# Patient Record
Sex: Female | Born: 1935
Health system: Southern US, Community
[De-identification: ages and names within clinical notes are randomized; demographics above are authoritative.]

## PROBLEM LIST (undated history)

## (undated) DIAGNOSIS — J189 Pneumonia, unspecified organism: Secondary | ICD-10-CM

---

## 2000-01-04 ENCOUNTER — Other Ambulatory Visit: Admission: RE | Admit: 2000-01-04 | Discharge: 2000-01-04 | Payer: Self-pay | Admitting: Obstetrics and Gynecology

## 2010-12-15 ENCOUNTER — Other Ambulatory Visit: Payer: Self-pay | Admitting: Ophthalmology

## 2011-11-10 DIAGNOSIS — M7750 Other enthesopathy of unspecified foot: Secondary | ICD-10-CM | POA: Diagnosis not present

## 2011-11-21 DIAGNOSIS — M722 Plantar fascial fibromatosis: Secondary | ICD-10-CM | POA: Diagnosis not present

## 2011-11-25 DIAGNOSIS — M722 Plantar fascial fibromatosis: Secondary | ICD-10-CM | POA: Diagnosis not present

## 2011-11-30 DIAGNOSIS — M722 Plantar fascial fibromatosis: Secondary | ICD-10-CM | POA: Diagnosis not present

## 2011-12-31 DIAGNOSIS — M722 Plantar fascial fibromatosis: Secondary | ICD-10-CM | POA: Diagnosis not present

## 2012-01-03 DIAGNOSIS — M722 Plantar fascial fibromatosis: Secondary | ICD-10-CM | POA: Diagnosis not present

## 2012-01-24 DIAGNOSIS — M899 Disorder of bone, unspecified: Secondary | ICD-10-CM | POA: Diagnosis not present

## 2012-01-24 DIAGNOSIS — M722 Plantar fascial fibromatosis: Secondary | ICD-10-CM | POA: Diagnosis not present

## 2012-03-14 DIAGNOSIS — H35369 Drusen (degenerative) of macula, unspecified eye: Secondary | ICD-10-CM | POA: Diagnosis not present

## 2012-03-29 DIAGNOSIS — Z1231 Encounter for screening mammogram for malignant neoplasm of breast: Secondary | ICD-10-CM | POA: Diagnosis not present

## 2012-04-25 DIAGNOSIS — M899 Disorder of bone, unspecified: Secondary | ICD-10-CM | POA: Diagnosis not present

## 2012-04-25 DIAGNOSIS — M949 Disorder of cartilage, unspecified: Secondary | ICD-10-CM | POA: Diagnosis not present

## 2012-04-27 DIAGNOSIS — Z23 Encounter for immunization: Secondary | ICD-10-CM | POA: Diagnosis not present

## 2012-06-04 DIAGNOSIS — D7589 Other specified diseases of blood and blood-forming organs: Secondary | ICD-10-CM | POA: Diagnosis not present

## 2012-06-04 DIAGNOSIS — I10 Essential (primary) hypertension: Secondary | ICD-10-CM | POA: Diagnosis not present

## 2012-06-04 DIAGNOSIS — Z1331 Encounter for screening for depression: Secondary | ICD-10-CM | POA: Diagnosis not present

## 2012-06-04 DIAGNOSIS — M899 Disorder of bone, unspecified: Secondary | ICD-10-CM | POA: Diagnosis not present

## 2012-06-07 DIAGNOSIS — D510 Vitamin B12 deficiency anemia due to intrinsic factor deficiency: Secondary | ICD-10-CM | POA: Diagnosis not present

## 2012-06-08 DIAGNOSIS — D510 Vitamin B12 deficiency anemia due to intrinsic factor deficiency: Secondary | ICD-10-CM | POA: Diagnosis not present

## 2012-06-11 DIAGNOSIS — D510 Vitamin B12 deficiency anemia due to intrinsic factor deficiency: Secondary | ICD-10-CM | POA: Diagnosis not present

## 2012-06-12 DIAGNOSIS — D510 Vitamin B12 deficiency anemia due to intrinsic factor deficiency: Secondary | ICD-10-CM | POA: Diagnosis not present

## 2012-06-13 DIAGNOSIS — D510 Vitamin B12 deficiency anemia due to intrinsic factor deficiency: Secondary | ICD-10-CM | POA: Diagnosis not present

## 2012-06-14 DIAGNOSIS — D510 Vitamin B12 deficiency anemia due to intrinsic factor deficiency: Secondary | ICD-10-CM | POA: Diagnosis not present

## 2012-06-15 DIAGNOSIS — D510 Vitamin B12 deficiency anemia due to intrinsic factor deficiency: Secondary | ICD-10-CM | POA: Diagnosis not present

## 2012-06-19 DIAGNOSIS — D510 Vitamin B12 deficiency anemia due to intrinsic factor deficiency: Secondary | ICD-10-CM | POA: Diagnosis not present

## 2012-06-26 DIAGNOSIS — D510 Vitamin B12 deficiency anemia due to intrinsic factor deficiency: Secondary | ICD-10-CM | POA: Diagnosis not present

## 2012-07-03 DIAGNOSIS — D510 Vitamin B12 deficiency anemia due to intrinsic factor deficiency: Secondary | ICD-10-CM | POA: Diagnosis not present

## 2012-07-10 DIAGNOSIS — D510 Vitamin B12 deficiency anemia due to intrinsic factor deficiency: Secondary | ICD-10-CM | POA: Diagnosis not present

## 2012-08-14 DIAGNOSIS — D510 Vitamin B12 deficiency anemia due to intrinsic factor deficiency: Secondary | ICD-10-CM | POA: Diagnosis not present

## 2012-09-17 DIAGNOSIS — D510 Vitamin B12 deficiency anemia due to intrinsic factor deficiency: Secondary | ICD-10-CM | POA: Diagnosis not present

## 2012-10-16 DIAGNOSIS — E538 Deficiency of other specified B group vitamins: Secondary | ICD-10-CM | POA: Diagnosis not present

## 2012-10-16 DIAGNOSIS — I10 Essential (primary) hypertension: Secondary | ICD-10-CM | POA: Diagnosis not present

## 2012-11-19 DIAGNOSIS — D510 Vitamin B12 deficiency anemia due to intrinsic factor deficiency: Secondary | ICD-10-CM | POA: Diagnosis not present

## 2012-12-21 DIAGNOSIS — D510 Vitamin B12 deficiency anemia due to intrinsic factor deficiency: Secondary | ICD-10-CM | POA: Diagnosis not present

## 2013-01-22 DIAGNOSIS — D510 Vitamin B12 deficiency anemia due to intrinsic factor deficiency: Secondary | ICD-10-CM | POA: Diagnosis not present

## 2013-02-22 DIAGNOSIS — D510 Vitamin B12 deficiency anemia due to intrinsic factor deficiency: Secondary | ICD-10-CM | POA: Diagnosis not present

## 2013-03-26 DIAGNOSIS — D510 Vitamin B12 deficiency anemia due to intrinsic factor deficiency: Secondary | ICD-10-CM | POA: Diagnosis not present

## 2013-04-08 DIAGNOSIS — Z1231 Encounter for screening mammogram for malignant neoplasm of breast: Secondary | ICD-10-CM | POA: Diagnosis not present

## 2013-04-26 DIAGNOSIS — D510 Vitamin B12 deficiency anemia due to intrinsic factor deficiency: Secondary | ICD-10-CM | POA: Diagnosis not present

## 2013-05-28 DIAGNOSIS — D510 Vitamin B12 deficiency anemia due to intrinsic factor deficiency: Secondary | ICD-10-CM | POA: Diagnosis not present

## 2013-05-28 DIAGNOSIS — Z23 Encounter for immunization: Secondary | ICD-10-CM | POA: Diagnosis not present

## 2013-06-21 DIAGNOSIS — Z0000 Encounter for general adult medical examination without abnormal findings: Secondary | ICD-10-CM | POA: Diagnosis not present

## 2013-06-21 DIAGNOSIS — Z1331 Encounter for screening for depression: Secondary | ICD-10-CM | POA: Diagnosis not present

## 2013-06-21 DIAGNOSIS — D510 Vitamin B12 deficiency anemia due to intrinsic factor deficiency: Secondary | ICD-10-CM | POA: Diagnosis not present

## 2013-06-21 DIAGNOSIS — M899 Disorder of bone, unspecified: Secondary | ICD-10-CM | POA: Diagnosis not present

## 2013-07-01 DIAGNOSIS — D510 Vitamin B12 deficiency anemia due to intrinsic factor deficiency: Secondary | ICD-10-CM | POA: Diagnosis not present

## 2013-08-05 DIAGNOSIS — D510 Vitamin B12 deficiency anemia due to intrinsic factor deficiency: Secondary | ICD-10-CM | POA: Diagnosis not present

## 2013-09-06 DIAGNOSIS — D510 Vitamin B12 deficiency anemia due to intrinsic factor deficiency: Secondary | ICD-10-CM | POA: Diagnosis not present

## 2013-10-07 DIAGNOSIS — D510 Vitamin B12 deficiency anemia due to intrinsic factor deficiency: Secondary | ICD-10-CM | POA: Diagnosis not present

## 2013-11-08 DIAGNOSIS — D510 Vitamin B12 deficiency anemia due to intrinsic factor deficiency: Secondary | ICD-10-CM | POA: Diagnosis not present

## 2013-11-13 DIAGNOSIS — H35369 Drusen (degenerative) of macula, unspecified eye: Secondary | ICD-10-CM | POA: Diagnosis not present

## 2013-12-09 DIAGNOSIS — D510 Vitamin B12 deficiency anemia due to intrinsic factor deficiency: Secondary | ICD-10-CM | POA: Diagnosis not present

## 2013-12-31 ENCOUNTER — Ambulatory Visit
Admission: RE | Admit: 2013-12-31 | Discharge: 2013-12-31 | Disposition: A | Discharge status: Home / Self Care | Payer: Medicare Other | Source: Ambulatory Visit | Attending: Nurse Practitioner | Admitting: Nurse Practitioner

## 2013-12-31 ENCOUNTER — Other Ambulatory Visit: Payer: Self-pay | Admitting: Nurse Practitioner

## 2013-12-31 DIAGNOSIS — J189 Pneumonia, unspecified organism: Secondary | ICD-10-CM | POA: Diagnosis not present

## 2014-01-02 DIAGNOSIS — J189 Pneumonia, unspecified organism: Secondary | ICD-10-CM | POA: Diagnosis not present

## 2014-01-07 DIAGNOSIS — J189 Pneumonia, unspecified organism: Secondary | ICD-10-CM | POA: Diagnosis not present

## 2014-01-28 ENCOUNTER — Other Ambulatory Visit: Payer: Self-pay | Admitting: Internal Medicine

## 2014-01-28 ENCOUNTER — Ambulatory Visit
Admission: RE | Admit: 2014-01-28 | Discharge: 2014-01-28 | Disposition: A | Discharge status: Home / Self Care | Payer: Medicare Other | Source: Ambulatory Visit | Attending: Internal Medicine | Admitting: Internal Medicine

## 2014-01-28 DIAGNOSIS — J189 Pneumonia, unspecified organism: Secondary | ICD-10-CM

## 2014-04-09 DIAGNOSIS — Z1231 Encounter for screening mammogram for malignant neoplasm of breast: Secondary | ICD-10-CM | POA: Diagnosis not present

## 2014-04-28 DIAGNOSIS — Z23 Encounter for immunization: Secondary | ICD-10-CM | POA: Diagnosis not present

## 2014-06-15 ENCOUNTER — Encounter (HOSPITAL_COMMUNITY): Payer: Self-pay

## 2014-06-15 ENCOUNTER — Emergency Department (HOSPITAL_COMMUNITY): Payer: MEDICARE

## 2014-06-15 ENCOUNTER — Emergency Department (HOSPITAL_COMMUNITY)
Admission: EM | Admit: 2014-06-15 | Discharge: 2014-06-15 | Disposition: A | Discharge status: Home / Self Care | Payer: MEDICARE | Attending: Emergency Medicine | Admitting: Emergency Medicine

## 2014-06-15 DIAGNOSIS — I517 Cardiomegaly: Secondary | ICD-10-CM | POA: Diagnosis not present

## 2014-06-15 DIAGNOSIS — R05 Cough: Secondary | ICD-10-CM | POA: Diagnosis present

## 2014-06-15 DIAGNOSIS — J189 Pneumonia, unspecified organism: Secondary | ICD-10-CM

## 2014-06-15 DIAGNOSIS — J159 Unspecified bacterial pneumonia: Secondary | ICD-10-CM | POA: Diagnosis not present

## 2014-06-15 DIAGNOSIS — R918 Other nonspecific abnormal finding of lung field: Secondary | ICD-10-CM | POA: Diagnosis not present

## 2014-06-15 DIAGNOSIS — R059 Cough, unspecified: Secondary | ICD-10-CM

## 2014-06-15 HISTORY — DX: Pneumonia, unspecified organism: J18.9

## 2014-06-15 LAB — COMPREHENSIVE METABOLIC PANEL
ALBUMIN: 3.6 g/dL (ref 3.5–5.2)
ALT: 17 U/L (ref 0–35)
ANION GAP: 13 (ref 5–15)
AST: 26 U/L (ref 0–37)
Alkaline Phosphatase: 103 U/L (ref 39–117)
BUN: 12 mg/dL (ref 6–23)
CALCIUM: 9.3 mg/dL (ref 8.4–10.5)
CO2: 26 mEq/L (ref 19–32)
CREATININE: 0.75 mg/dL (ref 0.50–1.10)
Chloride: 100 mEq/L (ref 96–112)
GFR calc Af Amer: 89 mL/min — ABNORMAL LOW (ref >90)
GFR calc non Af Amer: 77 mL/min — ABNORMAL LOW (ref >90)
Glucose, Bld: 92 mg/dL (ref 70–99)
POTASSIUM: 4.3 meq/L (ref 3.7–5.3)
Sodium: 139 mEq/L (ref 137–147)
TOTAL PROTEIN: 7.4 g/dL (ref 6.0–8.3)
Total Bilirubin: 0.4 mg/dL (ref 0.3–1.2)

## 2014-06-15 LAB — CBC WITH DIFFERENTIAL/PLATELET
BASOS PCT: 1 % (ref 0–1)
Basophils Absolute: 0 10*3/uL (ref 0.0–0.1)
EOS ABS: 0.3 10*3/uL (ref 0.0–0.7)
Eosinophils Relative: 5 % (ref 0–5)
HCT: 40.7 % (ref 36.0–46.0)
Hemoglobin: 13.3 g/dL (ref 12.0–15.0)
Lymphocytes Relative: 44 % (ref 12–46)
Lymphs Abs: 2.5 10*3/uL (ref 0.7–4.0)
MCH: 29.7 pg (ref 26.0–34.0)
MCHC: 32.7 g/dL (ref 30.0–36.0)
MCV: 90.8 fL (ref 78.0–100.0)
MONOS PCT: 6 % (ref 3–12)
Monocytes Absolute: 0.4 10*3/uL (ref 0.1–1.0)
Neutro Abs: 2.5 10*3/uL (ref 1.7–7.7)
Neutrophils Relative %: 44 % (ref 43–77)
Platelets: 239 10*3/uL (ref 150–400)
RBC: 4.48 MIL/uL (ref 3.87–5.11)
RDW: 13 % (ref 11.5–15.5)
WBC: 5.7 10*3/uL (ref 4.0–10.5)

## 2014-06-15 MED ORDER — AZITHROMYCIN 250 MG PO TABS: 250.0000 mg | ORAL_TABLET | Freq: Every day | ORAL | Status: AC

## 2014-06-15 MED ORDER — DEXTROSE 5 % IV SOLN
1.0000 g | Freq: Once | INTRAVENOUS | Status: AC
  Administered 2014-06-15: 1 g via INTRAVENOUS
  Filled 2014-06-15: qty 10

## 2014-06-15 MED ORDER — AZITHROMYCIN 250 MG PO TABS
500.0000 mg | ORAL_TABLET | Freq: Once | ORAL | Status: AC
  Administered 2014-06-15: 500 mg via ORAL
  Filled 2014-06-15: qty 2

## 2014-06-15 NOTE — Discharge Instructions (Signed)

## 2014-06-15 NOTE — ED Provider Notes (Signed)
CSN: [id number]     Arrival date & time 06/15/14  1335 History   First MD Initiated Contact with Patient 06/15/14 1630     Chief Complaint  Patient presents with  . Cough   Patient is a 80 y.o. female presenting with cough. The history is provided by the patient.  Cough Cough characteristics:  Productive Sputum characteristics:  White and clear Severity:  Mild Onset quality:  Gradual Duration:  3 days Timing:  Intermittent Progression:  Worsening Chronicity:  New Smoker: no   Context: sick contacts   Context: not upper respiratory infection   Relieved by:  Nothing Worsened by:  Nothing tried Ineffective treatments: honey and lemon. Associated symptoms: no chest pain, no chills, no diaphoresis, no fever, no headaches, no rash, no shortness of breath and no sinus congestion   Risk factors: no recent infection     Past Medical History  Diagnosis Date  . Pneumonia    History reviewed. No pertinent past surgical history.   No family history on file.   History  Substance Use Topics  . Smoking status: Never Smoker   . Smokeless tobacco: Not on file  . Alcohol Use: No   OB History    No data available     Review of Systems  Constitutional: Negative for fever, chills and diaphoresis.  Respiratory: Positive for cough. Negative for chest tightness and shortness of breath.   Cardiovascular: Negative for chest pain and leg swelling.  Gastrointestinal: Negative for nausea, vomiting and abdominal pain.  Musculoskeletal: Negative for gait problem.  Skin: Negative for rash.  Neurological: Negative for speech difficulty and headaches.  All other systems reviewed and are negative.     Allergies  Review of patient's allergies indicates no known allergies.  Home Medications   Prior to Admission medications   Medication Sig Start Date End Date Taking? Authorizing Provider  Ascorbic Acid (VITAMIN C) 1000 MG tablet Take 1,000 mg by mouth daily.   Yes Historical Provider, MD   Multiple Vitamin (MULTI VITAMIN DAILY PO) Take 1 tablet by mouth daily.   Yes Historical Provider, MD  azithromycin (ZITHROMAX) 250 MG tablet Take 1 tablet (250 mg total) by mouth daily. 06/16/14 06/19/14  Berenice Primas, MD   BP 184/83 mmHg  Pulse 77  Temp(Src) 98.3 F (36.8 C) (Oral)  Resp 18  Ht 5\' 4"  (1.626 m)  SpO2 95%   Physical Exam  Constitutional: She is oriented to person, place, and time. She appears well-developed and well-nourished. She is cooperative. No distress.  Well appearing elderly female in no distress, smiling, laughing  HENT:  Head: Normocephalic and atraumatic.  Right Ear: External ear normal.  Left Ear: External ear normal.  Neck: Normal range of motion and phonation normal.  Cardiovascular: Normal rate and regular rhythm.   Pulmonary/Chest: Effort normal. No accessory muscle usage. No tachypnea. No respiratory distress. She has no wheezes. She has rhonchi (diffusely). She has no rales.  Speaking in complete sentences, no supplemental oxygen requirement  Abdominal: Soft. She exhibits no distension. There is no tenderness. There is no rebound and no guarding.  Neurological: She is alert and oriented to person, place, and time.  Skin: Skin is warm and dry. No rash noted. She is not diaphoretic.  Vitals reviewed.   ED Course  Procedures  Labs Review Labs Reviewed  COMPREHENSIVE METABOLIC PANEL - Abnormal; Notable for the following:    GFR calc non Af Amer 77 (*)    GFR calc Af Amer 89 (*)  All other components within normal limits  CBC WITH DIFFERENTIAL    Imaging Review Dg Chest 2 View  06/15/2014   CLINICAL DATA:  Productive cough for 2 days.  EXAM: CHEST  2 VIEW  COMPARISON:  01/28/2014 and 12/31/2013 chest radiographs  FINDINGS: Cardiomegaly and interstitial prominence again noted.  More focal left lower lobe opacity is suspicious for pneumonia, but had a similar appearance earlier this year.  There is no evidence of pneumothorax or pleural  effusion.  No acute bony abnormalities are identified.  IMPRESSION: Left lower lobe opacity suspicious for pneumonia. Radiographic follow-up to resolution recommended.  Unchanged cardiomegaly and interstitial prominence.   Electronically Signed   By: Hassan Rowan M.D.   On: 06/15/2014 15:19    MDM   Final diagnoses:  CAP (community acquired pneumonia)    80 y.o. female who reports a past medical history of community acquired pneumonia in June 2015. Presents to the ED due to a productive cough and feeling like her chest was "rattling". Denies chest pain, shortness of breath, nausea, vomiting, headache, weakness, fever, chills. She states that she otherwise feels fine, but was worried about the rattling noise. She has been trying honey and lemon to soothe her cough.  Remainder of history of present illness, review of systems, and exam as above. She is alert awake well appearing in no acute distress.  Chest x-ray obtained which shows a left lower lobe opacity concerning for pneumonia. CBC unremarkable. CMP essentially unremarkable except for a GFR of 77. Port score 72. Based on her clinical appearance she is suitable for outpatient management of her community-acquired pneumonia. She is speaking complete sentences, no distress, no supplemental oxygen requirement. Denies shortness of breath and chest pain. Ambulated from triage to her exam room with no distress.  Gave her a dose of IV Rocephin. If her 500 mg PO azithromycin. Will have her take azithromycin as an outpatient. I discussed very strict return precautions with the patient and these were given in writing as well. She is to follow-up with her primary care physician this week. She understands to return immediately to the emergency department for any new or worsening symptoms.  This case was managed in conjunction with my attending, Dr. Winfred Leeds.     Berenice Primas, MD 06/15/14 [place], MD 06/16/14 Laureen Abrahams

## 2014-06-15 NOTE — ED Provider Notes (Signed)
Complains of productive cough yellow sputum for 3 days. She denies shortness of breath.no vomiting no other associated symptoms. Treated herself withHeidi and limited use, without relief. Past medical history pneumonia June2015on exam alert not ill-appearing no distress lungs scant rhonchi diffusely. No respiratory distress, speaks in paragraphs. Chest x-ray viewed by me. Port score equals 72. Patient is suitable for outpatient treatment for community acquired pneumonia  Orlie Dakin, MD 06/15/14 1747

## 2014-06-15 NOTE — ED Notes (Signed)
Pt has had a cough and congestion for the past 3 days. Passing it around with her husband. Has some rattling at night when she lays down.

## 2014-06-23 DIAGNOSIS — Z23 Encounter for immunization: Secondary | ICD-10-CM | POA: Diagnosis not present

## 2014-06-23 DIAGNOSIS — L821 Other seborrheic keratosis: Secondary | ICD-10-CM | POA: Diagnosis not present

## 2014-06-23 DIAGNOSIS — I10 Essential (primary) hypertension: Secondary | ICD-10-CM | POA: Diagnosis not present

## 2014-06-23 DIAGNOSIS — R3915 Urgency of urination: Secondary | ICD-10-CM | POA: Diagnosis not present

## 2014-06-23 DIAGNOSIS — J189 Pneumonia, unspecified organism: Secondary | ICD-10-CM | POA: Diagnosis not present

## 2014-06-23 DIAGNOSIS — D510 Vitamin B12 deficiency anemia due to intrinsic factor deficiency: Secondary | ICD-10-CM | POA: Diagnosis not present

## 2014-07-03 DIAGNOSIS — J189 Pneumonia, unspecified organism: Secondary | ICD-10-CM | POA: Diagnosis not present

## 2014-07-14 ENCOUNTER — Ambulatory Visit
Admission: RE | Admit: 2014-07-14 | Discharge: 2014-07-14 | Disposition: A | Discharge status: Home / Self Care | Payer: Medicare Other | Source: Ambulatory Visit | Attending: Internal Medicine | Admitting: Internal Medicine

## 2014-07-14 ENCOUNTER — Other Ambulatory Visit: Payer: Self-pay | Admitting: Internal Medicine

## 2014-07-14 DIAGNOSIS — J189 Pneumonia, unspecified organism: Secondary | ICD-10-CM

## 2014-08-29 DIAGNOSIS — Z1389 Encounter for screening for other disorder: Secondary | ICD-10-CM | POA: Diagnosis not present

## 2014-08-29 DIAGNOSIS — D510 Vitamin B12 deficiency anemia due to intrinsic factor deficiency: Secondary | ICD-10-CM | POA: Diagnosis not present

## 2014-08-29 DIAGNOSIS — Z6841 Body Mass Index (BMI) 40.0 and over, adult: Secondary | ICD-10-CM | POA: Diagnosis not present

## 2014-08-29 DIAGNOSIS — Z0000 Encounter for general adult medical examination without abnormal findings: Secondary | ICD-10-CM | POA: Diagnosis not present

## 2014-08-29 DIAGNOSIS — M8580 Other specified disorders of bone density and structure, unspecified site: Secondary | ICD-10-CM | POA: Diagnosis not present

## 2014-09-11 DIAGNOSIS — L820 Inflamed seborrheic keratosis: Secondary | ICD-10-CM | POA: Diagnosis not present

## 2014-09-11 DIAGNOSIS — L821 Other seborrheic keratosis: Secondary | ICD-10-CM | POA: Diagnosis not present

## 2014-09-30 DIAGNOSIS — M810 Age-related osteoporosis without current pathological fracture: Secondary | ICD-10-CM | POA: Diagnosis not present

## 2014-12-16 DIAGNOSIS — H5213 Myopia, bilateral: Secondary | ICD-10-CM | POA: Diagnosis not present

## 2014-12-16 DIAGNOSIS — Z961 Presence of intraocular lens: Secondary | ICD-10-CM | POA: Diagnosis not present

## 2015-04-14 DIAGNOSIS — Z1231 Encounter for screening mammogram for malignant neoplasm of breast: Secondary | ICD-10-CM | POA: Diagnosis not present

## 2015-05-11 DIAGNOSIS — R221 Localized swelling, mass and lump, neck: Secondary | ICD-10-CM | POA: Diagnosis not present

## 2015-08-05 DIAGNOSIS — J069 Acute upper respiratory infection, unspecified: Secondary | ICD-10-CM | POA: Diagnosis not present

## 2015-09-03 DIAGNOSIS — Z6841 Body Mass Index (BMI) 40.0 and over, adult: Secondary | ICD-10-CM | POA: Diagnosis not present

## 2015-09-03 DIAGNOSIS — D510 Vitamin B12 deficiency anemia due to intrinsic factor deficiency: Secondary | ICD-10-CM | POA: Diagnosis not present

## 2015-09-03 DIAGNOSIS — Z1389 Encounter for screening for other disorder: Secondary | ICD-10-CM | POA: Diagnosis not present

## 2015-09-03 DIAGNOSIS — M8580 Other specified disorders of bone density and structure, unspecified site: Secondary | ICD-10-CM | POA: Diagnosis not present

## 2015-09-03 DIAGNOSIS — I10 Essential (primary) hypertension: Secondary | ICD-10-CM | POA: Diagnosis not present

## 2015-11-19 DIAGNOSIS — N816 Rectocele: Secondary | ICD-10-CM | POA: Diagnosis not present

## 2015-11-24 IMAGING — CR DG CHEST 2V
2 series · 2 of 2 positions shown · non-contrast
Comparison: None.

CLINICAL DATA: Cough, congestion, wheezing

EXAM:
CHEST  2 VIEW

[w chest pa]
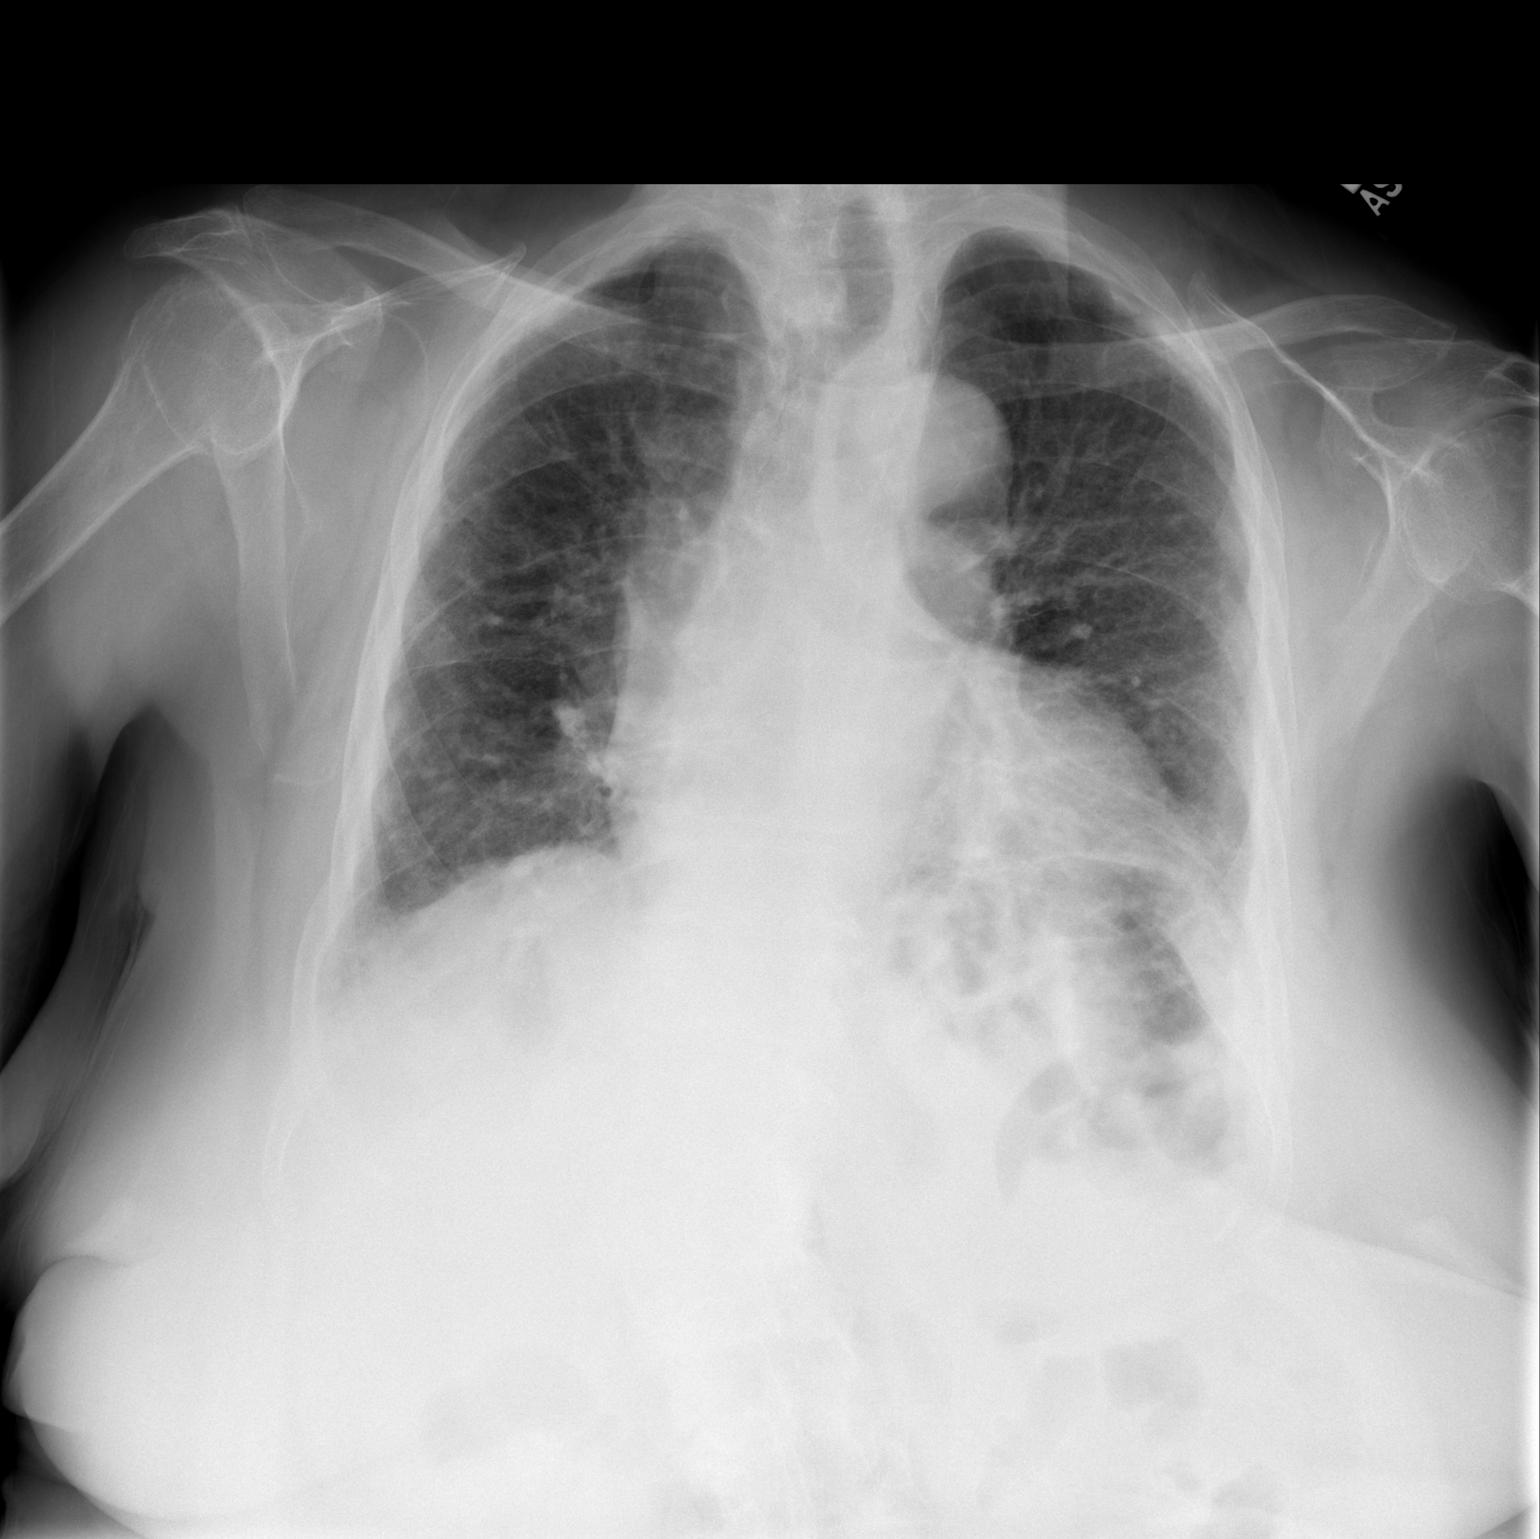

[w chest lat]
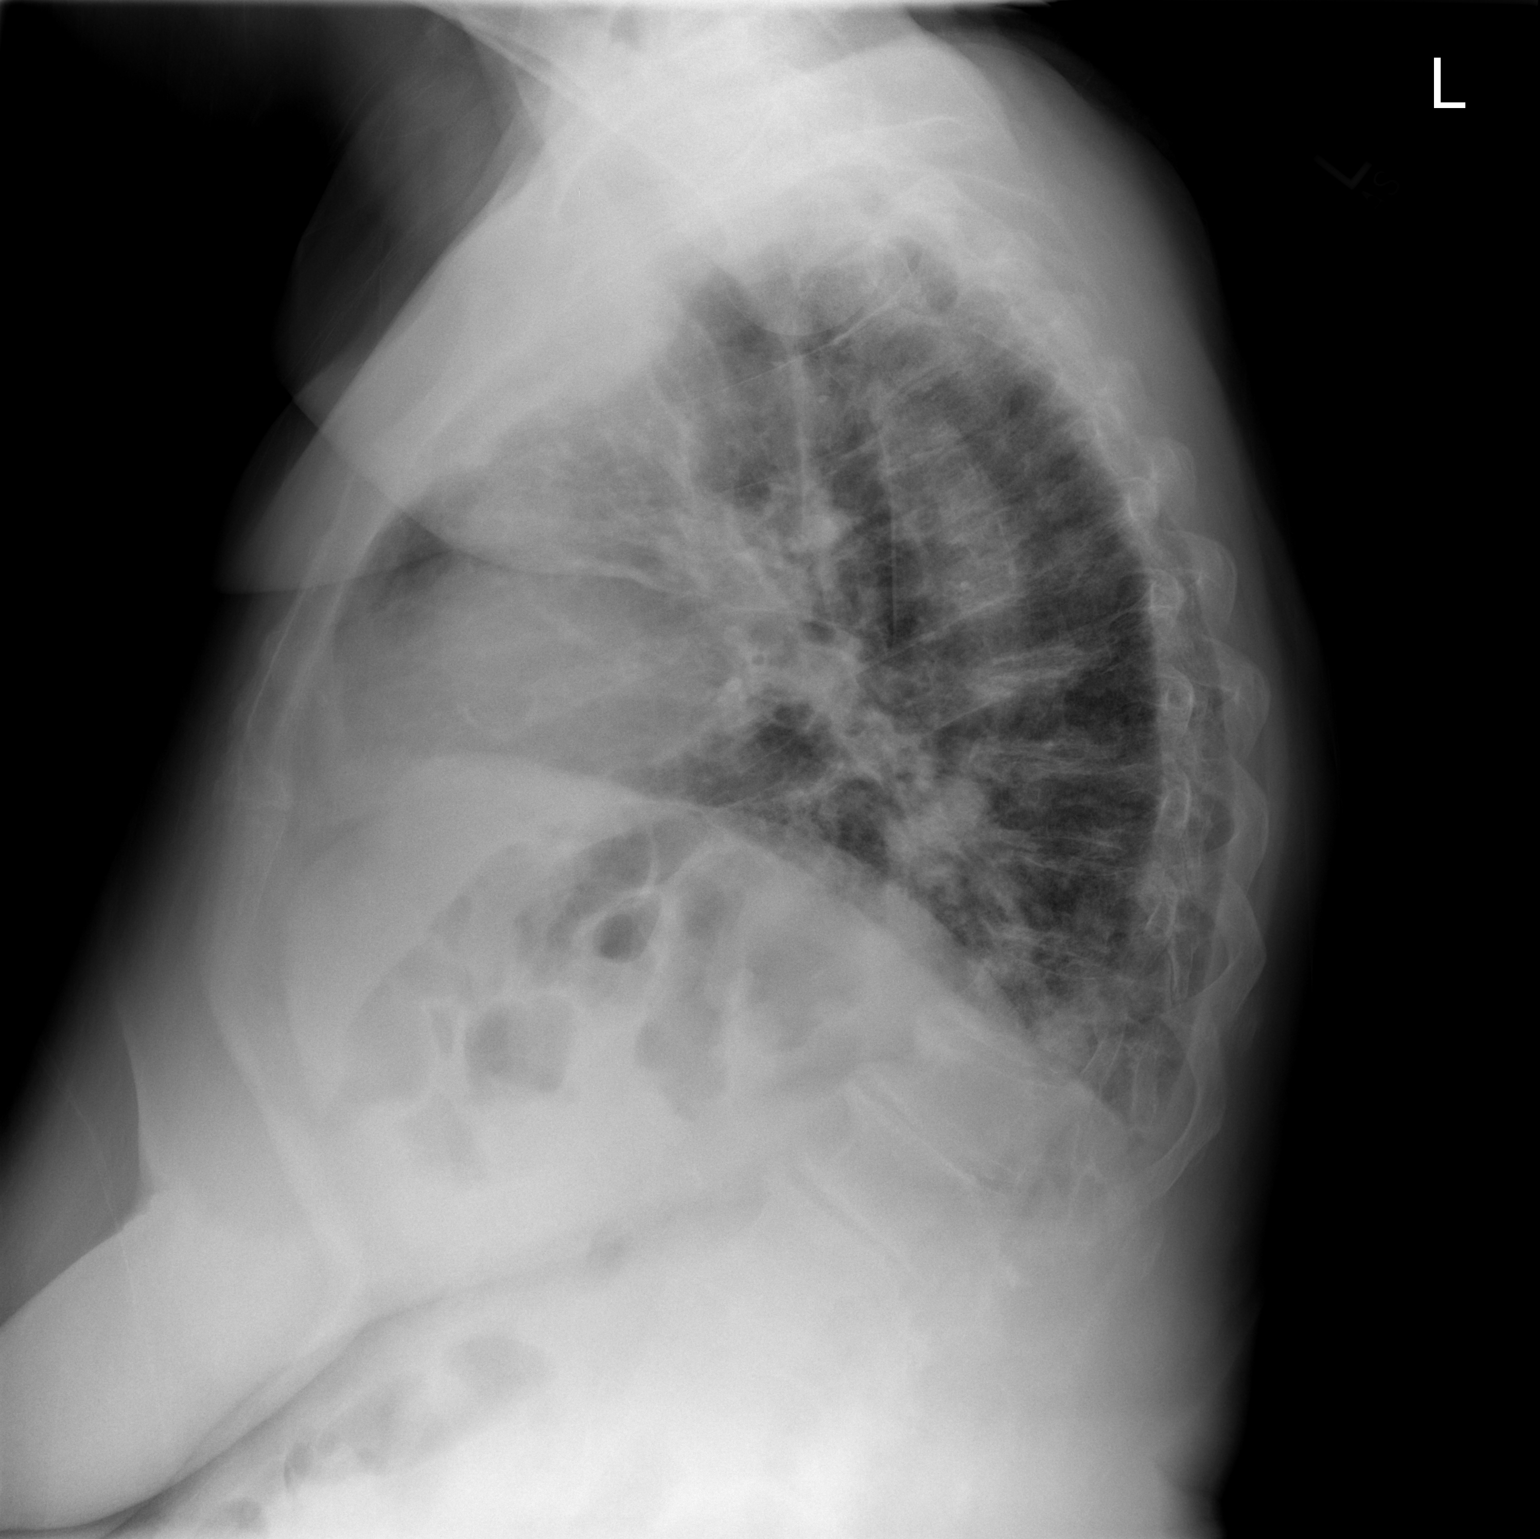

[2 of 2 positions shown; findings below may reference images not displayed]

FINDINGS: There are prominent markings medially and posteriorly in the left
lower lobe most consistent with pneumonia. No definite pleural
effusion is seen. There is some peribronchial thickening as well
which may indicate bronchitis. There is cardiomegaly present. The
bones are osteopenic and there is a thoracolumbar scoliosis with
associated degenerative change.
IMPRESSION: Left lower lobe pneumonia.  Probable bronchitis.  Cardiomegaly

## 2015-11-30 DIAGNOSIS — N816 Rectocele: Secondary | ICD-10-CM | POA: Diagnosis not present

## 2015-12-18 DIAGNOSIS — Z961 Presence of intraocular lens: Secondary | ICD-10-CM | POA: Diagnosis not present

## 2015-12-18 DIAGNOSIS — H5213 Myopia, bilateral: Secondary | ICD-10-CM | POA: Diagnosis not present

## 2016-04-14 DIAGNOSIS — Z1231 Encounter for screening mammogram for malignant neoplasm of breast: Secondary | ICD-10-CM | POA: Diagnosis not present

## 2016-06-03 DIAGNOSIS — Z23 Encounter for immunization: Secondary | ICD-10-CM | POA: Diagnosis not present

## 2016-10-11 DIAGNOSIS — M8580 Other specified disorders of bone density and structure, unspecified site: Secondary | ICD-10-CM | POA: Diagnosis not present

## 2016-10-11 DIAGNOSIS — Z23 Encounter for immunization: Secondary | ICD-10-CM | POA: Diagnosis not present

## 2016-10-11 DIAGNOSIS — I10 Essential (primary) hypertension: Secondary | ICD-10-CM | POA: Diagnosis not present

## 2016-10-11 DIAGNOSIS — D510 Vitamin B12 deficiency anemia due to intrinsic factor deficiency: Secondary | ICD-10-CM | POA: Diagnosis not present

## 2016-10-11 DIAGNOSIS — Z0000 Encounter for general adult medical examination without abnormal findings: Secondary | ICD-10-CM | POA: Diagnosis not present

## 2016-10-11 DIAGNOSIS — I350 Nonrheumatic aortic (valve) stenosis: Secondary | ICD-10-CM | POA: Diagnosis not present

## 2016-10-11 DIAGNOSIS — Z7189 Other specified counseling: Secondary | ICD-10-CM | POA: Diagnosis not present

## 2016-10-11 DIAGNOSIS — Z1389 Encounter for screening for other disorder: Secondary | ICD-10-CM | POA: Diagnosis not present

## 2016-10-25 DIAGNOSIS — R319 Hematuria, unspecified: Secondary | ICD-10-CM | POA: Diagnosis not present

## 2017-02-24 DIAGNOSIS — Z961 Presence of intraocular lens: Secondary | ICD-10-CM | POA: Diagnosis not present

## 2017-02-24 DIAGNOSIS — H524 Presbyopia: Secondary | ICD-10-CM | POA: Diagnosis not present

## 2017-03-28 DIAGNOSIS — N816 Rectocele: Secondary | ICD-10-CM | POA: Diagnosis not present

## 2017-05-02 DIAGNOSIS — Z23 Encounter for immunization: Secondary | ICD-10-CM | POA: Diagnosis not present

## 2017-06-06 DIAGNOSIS — Z1231 Encounter for screening mammogram for malignant neoplasm of breast: Secondary | ICD-10-CM | POA: Diagnosis not present

## 2017-09-18 DIAGNOSIS — R300 Dysuria: Secondary | ICD-10-CM | POA: Diagnosis not present

## 2017-09-18 DIAGNOSIS — N816 Rectocele: Secondary | ICD-10-CM | POA: Diagnosis not present

## 2017-10-16 DIAGNOSIS — D510 Vitamin B12 deficiency anemia due to intrinsic factor deficiency: Secondary | ICD-10-CM | POA: Diagnosis not present

## 2017-10-16 DIAGNOSIS — Z1389 Encounter for screening for other disorder: Secondary | ICD-10-CM | POA: Diagnosis not present

## 2017-10-16 DIAGNOSIS — N816 Rectocele: Secondary | ICD-10-CM | POA: Diagnosis not present

## 2017-10-16 DIAGNOSIS — M8580 Other specified disorders of bone density and structure, unspecified site: Secondary | ICD-10-CM | POA: Diagnosis not present

## 2017-10-16 DIAGNOSIS — I350 Nonrheumatic aortic (valve) stenosis: Secondary | ICD-10-CM | POA: Diagnosis not present

## 2017-10-16 DIAGNOSIS — J4520 Mild intermittent asthma, uncomplicated: Secondary | ICD-10-CM | POA: Diagnosis not present

## 2017-10-16 DIAGNOSIS — Z0000 Encounter for general adult medical examination without abnormal findings: Secondary | ICD-10-CM | POA: Diagnosis not present

## 2017-11-28 DIAGNOSIS — M8588 Other specified disorders of bone density and structure, other site: Secondary | ICD-10-CM | POA: Diagnosis not present

## 2017-11-28 DIAGNOSIS — M810 Age-related osteoporosis without current pathological fracture: Secondary | ICD-10-CM | POA: Diagnosis not present

## 2018-01-09 DIAGNOSIS — N816 Rectocele: Secondary | ICD-10-CM | POA: Diagnosis not present

## 2018-01-09 DIAGNOSIS — N8112 Cystocele, lateral: Secondary | ICD-10-CM | POA: Diagnosis not present

## 2018-01-09 DIAGNOSIS — N8111 Cystocele, midline: Secondary | ICD-10-CM | POA: Diagnosis not present

## 2018-01-09 DIAGNOSIS — N8189 Other female genital prolapse: Secondary | ICD-10-CM | POA: Diagnosis not present

## 2018-01-09 DIAGNOSIS — N952 Postmenopausal atrophic vaginitis: Secondary | ICD-10-CM | POA: Diagnosis not present

## 2018-01-09 DIAGNOSIS — N905 Atrophy of vulva: Secondary | ICD-10-CM | POA: Diagnosis not present

## 2018-01-18 DIAGNOSIS — D510 Vitamin B12 deficiency anemia due to intrinsic factor deficiency: Secondary | ICD-10-CM | POA: Diagnosis not present

## 2018-02-28 DIAGNOSIS — Z961 Presence of intraocular lens: Secondary | ICD-10-CM | POA: Diagnosis not present

## 2018-02-28 DIAGNOSIS — H524 Presbyopia: Secondary | ICD-10-CM | POA: Diagnosis not present

## 2018-05-10 DIAGNOSIS — Z23 Encounter for immunization: Secondary | ICD-10-CM | POA: Diagnosis not present

## 2018-06-11 DIAGNOSIS — Z1231 Encounter for screening mammogram for malignant neoplasm of breast: Secondary | ICD-10-CM | POA: Diagnosis not present

## 2018-12-25 DIAGNOSIS — Z0000 Encounter for general adult medical examination without abnormal findings: Secondary | ICD-10-CM | POA: Diagnosis not present

## 2018-12-25 DIAGNOSIS — Z1389 Encounter for screening for other disorder: Secondary | ICD-10-CM | POA: Diagnosis not present

## 2018-12-25 DIAGNOSIS — I350 Nonrheumatic aortic (valve) stenosis: Secondary | ICD-10-CM | POA: Diagnosis not present

## 2019-03-04 DIAGNOSIS — Z961 Presence of intraocular lens: Secondary | ICD-10-CM | POA: Diagnosis not present

## 2019-03-04 DIAGNOSIS — H524 Presbyopia: Secondary | ICD-10-CM | POA: Diagnosis not present

## 2019-03-28 ENCOUNTER — Encounter: Payer: Self-pay | Admitting: Emergency Medicine

## 2019-03-28 ENCOUNTER — Other Ambulatory Visit: Payer: Self-pay

## 2019-03-28 ENCOUNTER — Inpatient Hospital Stay
Admission: EM | Admit: 2019-03-28 | Discharge: 2019-03-28 | DRG: 563 | Disposition: A | Discharge status: Other Acute Inpatient Hospital | Payer: Medicare HMO | Attending: Internal Medicine | Admitting: Internal Medicine

## 2019-03-28 ENCOUNTER — Inpatient Hospital Stay: Payer: Medicare HMO

## 2019-03-28 ENCOUNTER — Inpatient Hospital Stay (HOSPITAL_COMMUNITY)
Admission: AD | Admit: 2019-03-28 | Payer: Medicare HMO | Source: Other Acute Inpatient Hospital | Admitting: Internal Medicine

## 2019-03-28 ENCOUNTER — Emergency Department: Payer: Medicare HMO

## 2019-03-28 ENCOUNTER — Inpatient Hospital Stay (HOSPITAL_COMMUNITY)
Admission: AD | Admit: 2019-03-28 | Discharge: 2019-04-04 | DRG: 492 | Disposition: A | Discharge status: Home Health | Payer: Medicare HMO | Attending: Internal Medicine | Admitting: Internal Medicine

## 2019-03-28 DIAGNOSIS — S82832D Other fracture of upper and lower end of left fibula, subsequent encounter for closed fracture with routine healing: Secondary | ICD-10-CM | POA: Diagnosis not present

## 2019-03-28 DIAGNOSIS — J8489 Other specified interstitial pulmonary diseases: Secondary | ICD-10-CM | POA: Diagnosis not present

## 2019-03-28 DIAGNOSIS — M2550 Pain in unspecified joint: Secondary | ICD-10-CM | POA: Diagnosis not present

## 2019-03-28 DIAGNOSIS — S82302A Unspecified fracture of lower end of left tibia, initial encounter for closed fracture: Secondary | ICD-10-CM | POA: Diagnosis not present

## 2019-03-28 DIAGNOSIS — J9601 Acute respiratory failure with hypoxia: Secondary | ICD-10-CM | POA: Diagnosis not present

## 2019-03-28 DIAGNOSIS — S82392A Other fracture of lower end of left tibia, initial encounter for closed fracture: Principal | ICD-10-CM | POA: Diagnosis present

## 2019-03-28 DIAGNOSIS — M8580 Other specified disorders of bone density and structure, unspecified site: Secondary | ICD-10-CM | POA: Diagnosis present

## 2019-03-28 DIAGNOSIS — S82202A Unspecified fracture of shaft of left tibia, initial encounter for closed fracture: Secondary | ICD-10-CM

## 2019-03-28 DIAGNOSIS — J189 Pneumonia, unspecified organism: Secondary | ICD-10-CM | POA: Diagnosis not present

## 2019-03-28 DIAGNOSIS — R0689 Other abnormalities of breathing: Secondary | ICD-10-CM | POA: Diagnosis not present

## 2019-03-28 DIAGNOSIS — R748 Abnormal levels of other serum enzymes: Secondary | ICD-10-CM

## 2019-03-28 DIAGNOSIS — R410 Disorientation, unspecified: Secondary | ICD-10-CM | POA: Diagnosis not present

## 2019-03-28 DIAGNOSIS — T148XXA Other injury of unspecified body region, initial encounter: Secondary | ICD-10-CM

## 2019-03-28 DIAGNOSIS — S82402A Unspecified fracture of shaft of left fibula, initial encounter for closed fracture: Secondary | ICD-10-CM | POA: Diagnosis not present

## 2019-03-28 DIAGNOSIS — R000 Tachycardia, unspecified: Secondary | ICD-10-CM | POA: Diagnosis not present

## 2019-03-28 DIAGNOSIS — Z419 Encounter for procedure for purposes other than remedying health state, unspecified: Secondary | ICD-10-CM

## 2019-03-28 DIAGNOSIS — Z7401 Bed confinement status: Secondary | ICD-10-CM | POA: Diagnosis not present

## 2019-03-28 DIAGNOSIS — R0902 Hypoxemia: Secondary | ICD-10-CM | POA: Diagnosis not present

## 2019-03-28 DIAGNOSIS — S0990XA Unspecified injury of head, initial encounter: Secondary | ICD-10-CM | POA: Diagnosis not present

## 2019-03-28 DIAGNOSIS — Z20828 Contact with and (suspected) exposure to other viral communicable diseases: Secondary | ICD-10-CM | POA: Diagnosis present

## 2019-03-28 DIAGNOSIS — S8262XA Displaced fracture of lateral malleolus of left fibula, initial encounter for closed fracture: Secondary | ICD-10-CM | POA: Diagnosis present

## 2019-03-28 DIAGNOSIS — Z79899 Other long term (current) drug therapy: Secondary | ICD-10-CM | POA: Diagnosis not present

## 2019-03-28 DIAGNOSIS — S82832A Other fracture of upper and lower end of left fibula, initial encounter for closed fracture: Secondary | ICD-10-CM | POA: Diagnosis present

## 2019-03-28 DIAGNOSIS — S2241XA Multiple fractures of ribs, right side, initial encounter for closed fracture: Secondary | ICD-10-CM | POA: Diagnosis present

## 2019-03-28 DIAGNOSIS — I10 Essential (primary) hypertension: Secondary | ICD-10-CM | POA: Diagnosis present

## 2019-03-28 DIAGNOSIS — I739 Peripheral vascular disease, unspecified: Secondary | ICD-10-CM | POA: Diagnosis not present

## 2019-03-28 DIAGNOSIS — J9811 Atelectasis: Secondary | ICD-10-CM | POA: Diagnosis not present

## 2019-03-28 DIAGNOSIS — Y92410 Unspecified street and highway as the place of occurrence of the external cause: Secondary | ICD-10-CM

## 2019-03-28 DIAGNOSIS — M549 Dorsalgia, unspecified: Secondary | ICD-10-CM | POA: Diagnosis present

## 2019-03-28 DIAGNOSIS — R52 Pain, unspecified: Secondary | ICD-10-CM | POA: Diagnosis not present

## 2019-03-28 DIAGNOSIS — S82302D Unspecified fracture of lower end of left tibia, subsequent encounter for closed fracture with routine healing: Secondary | ICD-10-CM | POA: Diagnosis not present

## 2019-03-28 DIAGNOSIS — M1712 Unilateral primary osteoarthritis, left knee: Secondary | ICD-10-CM | POA: Diagnosis not present

## 2019-03-28 DIAGNOSIS — S199XXA Unspecified injury of neck, initial encounter: Secondary | ICD-10-CM | POA: Diagnosis not present

## 2019-03-28 DIAGNOSIS — R531 Weakness: Secondary | ICD-10-CM | POA: Diagnosis not present

## 2019-03-28 LAB — COMPREHENSIVE METABOLIC PANEL
ALT: 29 U/L (ref 0–44)
AST: 47 U/L — ABNORMAL HIGH (ref 15–41)
Albumin: 3.7 g/dL (ref 3.5–5.0)
Alkaline Phosphatase: 79 U/L (ref 38–126)
Anion gap: 14 (ref 5–15)
BUN: 18 mg/dL (ref 8–23)
CO2: 23 mmol/L (ref 22–32)
Calcium: 9.1 mg/dL (ref 8.9–10.3)
Chloride: 105 mmol/L (ref 98–111)
Creatinine, Ser: 0.89 mg/dL (ref 0.44–1.00)
GFR calc Af Amer: >60 mL/min (ref >60)
GFR calc non Af Amer: 59 mL/min — ABNORMAL LOW (ref >60)
Glucose, Bld: 127 mg/dL — ABNORMAL HIGH (ref 70–99)
Potassium: 4.1 mmol/L (ref 3.5–5.1)
Sodium: 142 mmol/L (ref 135–145)
Total Bilirubin: 1 mg/dL (ref 0.3–1.2)
Total Protein: 7.3 g/dL (ref 6.5–8.1)

## 2019-03-28 LAB — CBC
HCT: 36.2 % (ref 36.0–46.0)
Hemoglobin: 11.8 g/dL — ABNORMAL LOW (ref 12.0–15.0)
MCH: 30.2 pg (ref 26.0–34.0)
MCHC: 32.6 g/dL (ref 30.0–36.0)
MCV: 92.6 fL (ref 80.0–100.0)
Platelets: 216 10*3/uL (ref 150–400)
RBC: 3.91 MIL/uL (ref 3.87–5.11)
RDW: 13.1 % (ref 11.5–15.5)
WBC: 9.1 10*3/uL (ref 4.0–10.5)
nRBC: 0 % (ref 0.0–0.2)

## 2019-03-28 LAB — CBC WITH DIFFERENTIAL/PLATELET
Abs Immature Granulocytes: 0.05 10*3/uL (ref 0.00–0.07)
Basophils Absolute: 0.1 10*3/uL (ref 0.0–0.1)
Basophils Relative: 1 %
Eosinophils Absolute: 0.3 10*3/uL (ref 0.0–0.5)
Eosinophils Relative: 4 %
HCT: 40.5 % (ref 36.0–46.0)
Hemoglobin: 13.2 g/dL (ref 12.0–15.0)
Immature Granulocytes: 1 %
Lymphocytes Relative: 24 %
Lymphs Abs: 1.8 10*3/uL (ref 0.7–4.0)
MCH: 29.7 pg (ref 26.0–34.0)
MCHC: 32.6 g/dL (ref 30.0–36.0)
MCV: 91.2 fL (ref 80.0–100.0)
Monocytes Absolute: 0.4 10*3/uL (ref 0.1–1.0)
Monocytes Relative: 6 %
Neutro Abs: 4.8 10*3/uL (ref 1.7–7.7)
Neutrophils Relative %: 64 %
Platelets: 228 10*3/uL (ref 150–400)
RBC: 4.44 MIL/uL (ref 3.87–5.11)
RDW: 13.1 % (ref 11.5–15.5)
WBC: 7.5 10*3/uL (ref 4.0–10.5)
nRBC: 0 % (ref 0.0–0.2)

## 2019-03-28 LAB — CREATININE, SERUM
Creatinine, Ser: 0.73 mg/dL (ref 0.44–1.00)
GFR calc Af Amer: >60 mL/min (ref >60)
GFR calc non Af Amer: >60 mL/min (ref >60)

## 2019-03-28 LAB — TYPE AND SCREEN
ABO/RH(D): B POS
Antibody Screen: NEGATIVE

## 2019-03-28 LAB — CK: Total CK: 1680 U/L — ABNORMAL HIGH (ref 38–234)

## 2019-03-28 LAB — TSH: TSH: 0.413 u[IU]/mL (ref 0.350–4.500)

## 2019-03-28 LAB — PHOSPHORUS: Phosphorus: 4.1 mg/dL (ref 2.5–4.6)

## 2019-03-28 LAB — MAGNESIUM: Magnesium: 1.7 mg/dL (ref 1.7–2.4)

## 2019-03-28 LAB — SARS CORONAVIRUS 2 BY RT PCR (HOSPITAL ORDER, PERFORMED IN ~~LOC~~ HOSPITAL LAB): SARS Coronavirus 2: NEGATIVE

## 2019-03-28 MED ORDER — MORPHINE SULFATE (PF) 2 MG/ML IV SOLN
2.0000 mg | INTRAVENOUS | Status: DC | PRN
  Administered 2019-03-29 (×2): 2 mg via INTRAVENOUS
  Filled 2019-03-28 (×2): qty 1

## 2019-03-28 MED ORDER — FENTANYL CITRATE (PF) 100 MCG/2ML IJ SOLN
12.5000 ug | INTRAMUSCULAR | Status: DC | PRN
  Administered 2019-03-28: 12.5 ug via INTRAVENOUS
  Filled 2019-03-28: qty 2

## 2019-03-28 MED ORDER — OXYCODONE-ACETAMINOPHEN 5-325 MG PO TABS
1.0000 | ORAL_TABLET | ORAL | Status: DC | PRN
  Administered 2019-03-28 – 2019-03-30 (×4): 1 via ORAL
  Filled 2019-03-28 (×4): qty 1

## 2019-03-28 MED ORDER — HEPARIN SODIUM (PORCINE) 5000 UNIT/ML IJ SOLN
5000.0000 [IU] | Freq: Three times a day (TID) | INTRAMUSCULAR | Status: DC
  Administered 2019-03-28: 5000 [IU] via SUBCUTANEOUS
  Filled 2019-03-28: qty 1

## 2019-03-28 NOTE — Progress Notes (Signed)
Distal tibia fracture will need ORIF.  Plan for gentle reduction in ED and post reduction CT scan.  Transfer to hospitalist service at [facility] to be planned at [facility] tomorrow with Dr. Doreatha Martin.  Please have coronavirus swab completed as well.
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
## 2019-03-28 NOTE — ED Notes (Signed)
Lab at bedside redrawing type and screen.
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
## 2019-03-29 ENCOUNTER — Encounter (HOSPITAL_COMMUNITY)
Admission: AD | Disposition: A | Discharge status: Home Health | Payer: Self-pay | Source: Home / Self Care | Attending: Internal Medicine

## 2019-03-29 ENCOUNTER — Inpatient Hospital Stay (HOSPITAL_COMMUNITY): Payer: Medicare HMO | Admitting: Anesthesiology

## 2019-03-29 ENCOUNTER — Other Ambulatory Visit: Payer: Self-pay

## 2019-03-29 ENCOUNTER — Encounter (HOSPITAL_COMMUNITY): Payer: Self-pay | Admitting: *Deleted

## 2019-03-29 ENCOUNTER — Inpatient Hospital Stay (HOSPITAL_COMMUNITY): Payer: Medicare HMO

## 2019-03-29 DIAGNOSIS — Z419 Encounter for procedure for purposes other than remedying health state, unspecified: Secondary | ICD-10-CM | POA: Insufficient documentation

## 2019-03-29 DIAGNOSIS — R748 Abnormal levels of other serum enzymes: Secondary | ICD-10-CM

## 2019-03-29 HISTORY — PX: OPEN REDUCTION INTERNAL FIXATION (ORIF) TIBIA/FIBULA FRACTURE: SHX5992

## 2019-03-29 LAB — CBC WITH DIFFERENTIAL/PLATELET
Abs Immature Granulocytes: 0.03 10*3/uL (ref 0.00–0.07)
Basophils Absolute: 0 10*3/uL (ref 0.0–0.1)
Basophils Relative: 0 %
Eosinophils Absolute: 0 10*3/uL (ref 0.0–0.5)
Eosinophils Relative: 0 %
HCT: 35.4 % — ABNORMAL LOW (ref 36.0–46.0)
Hemoglobin: 11.7 g/dL — ABNORMAL LOW (ref 12.0–15.0)
Immature Granulocytes: 0 %
Lymphocytes Relative: 8 %
Lymphs Abs: 0.7 10*3/uL (ref 0.7–4.0)
MCH: 30.2 pg (ref 26.0–34.0)
MCHC: 33.1 g/dL (ref 30.0–36.0)
MCV: 91.2 fL (ref 80.0–100.0)
Monocytes Absolute: 0.6 10*3/uL (ref 0.1–1.0)
Monocytes Relative: 7 %
Neutro Abs: 7.1 10*3/uL (ref 1.7–7.7)
Neutrophils Relative %: 85 %
Platelets: 192 10*3/uL (ref 150–400)
RBC: 3.88 MIL/uL (ref 3.87–5.11)
RDW: 12.9 % (ref 11.5–15.5)
WBC: 8.5 10*3/uL (ref 4.0–10.5)
nRBC: 0 % (ref 0.0–0.2)

## 2019-03-29 LAB — CBC
HCT: 33.2 % — ABNORMAL LOW (ref 36.0–46.0)
Hemoglobin: 10.8 g/dL — ABNORMAL LOW (ref 12.0–15.0)
MCH: 29.9 pg (ref 26.0–34.0)
MCHC: 32.5 g/dL (ref 30.0–36.0)
MCV: 92 fL (ref 80.0–100.0)
Platelets: 193 10*3/uL (ref 150–400)
RBC: 3.61 MIL/uL — ABNORMAL LOW (ref 3.87–5.11)
RDW: 13 % (ref 11.5–15.5)
WBC: 8.6 10*3/uL (ref 4.0–10.5)
nRBC: 0 % (ref 0.0–0.2)

## 2019-03-29 LAB — BASIC METABOLIC PANEL
Anion gap: 10 (ref 5–15)
BUN: 14 mg/dL (ref 8–23)
CO2: 26 mmol/L (ref 22–32)
Calcium: 8.4 mg/dL — ABNORMAL LOW (ref 8.9–10.3)
Chloride: 102 mmol/L (ref 98–111)
Creatinine, Ser: 0.76 mg/dL (ref 0.44–1.00)
GFR calc Af Amer: >60 mL/min (ref >60)
GFR calc non Af Amer: >60 mL/min (ref >60)
Glucose, Bld: 110 mg/dL — ABNORMAL HIGH (ref 70–99)
Potassium: 4 mmol/L (ref 3.5–5.1)
Sodium: 138 mmol/L (ref 135–145)

## 2019-03-29 LAB — CK
Total CK: 1782 U/L — ABNORMAL HIGH (ref 38–234)
Total CK: 1999 U/L — ABNORMAL HIGH (ref 38–234)

## 2019-03-29 LAB — SURGICAL PCR SCREEN
MRSA, PCR: NEGATIVE
Staphylococcus aureus: NEGATIVE

## 2019-03-29 SURGERY — OPEN REDUCTION INTERNAL FIXATION (ORIF) TIBIA/FIBULA FRACTURE
Anesthesia: General | Laterality: Left

## 2019-03-29 MED ORDER — ONDANSETRON HCL 4 MG/2ML IJ SOLN: 4.0000 mg | Freq: Once | INTRAMUSCULAR | Status: DC | PRN

## 2019-03-29 MED ORDER — VANCOMYCIN HCL 1000 MG IV SOLR
INTRAVENOUS | Status: AC
  Filled 2019-03-29: qty 1000

## 2019-03-29 MED ORDER — SODIUM CHLORIDE 0.9 % IV SOLN
INTRAVENOUS | Status: DC | PRN
  Administered 2019-03-29: 25 ug/min via INTRAVENOUS

## 2019-03-29 MED ORDER — FENTANYL CITRATE (PF) 100 MCG/2ML IJ SOLN: 25.0000 ug | INTRAMUSCULAR | Status: DC | PRN

## 2019-03-29 MED ORDER — LIDOCAINE 2% (20 MG/ML) 5 ML SYRINGE
INTRAMUSCULAR | Status: AC
  Filled 2019-03-29: qty 5

## 2019-03-29 MED ORDER — TOBRAMYCIN SULFATE 1.2 G IJ SOLR
INTRAMUSCULAR | Status: AC
  Filled 2019-03-29: qty 1.2

## 2019-03-29 MED ORDER — CEFAZOLIN SODIUM-DEXTROSE 2-4 GM/100ML-% IV SOLN
2.0000 g | INTRAVENOUS | Status: AC
  Administered 2019-03-29: 14:00:00 2 g via INTRAVENOUS
  Filled 2019-03-29: qty 100

## 2019-03-29 MED ORDER — SODIUM CHLORIDE 0.9 % IV SOLN
INTRAVENOUS | Status: DC
  Administered 2019-03-29 (×2): via INTRAVENOUS

## 2019-03-29 MED ORDER — GABAPENTIN 100 MG PO CAPS
100.0000 mg | ORAL_CAPSULE | Freq: Three times a day (TID) | ORAL | Status: DC
  Administered 2019-03-29 – 2019-03-30 (×3): 100 mg via ORAL
  Filled 2019-03-29 (×3): qty 1

## 2019-03-29 MED ORDER — ROCURONIUM BROMIDE 50 MG/5ML IV SOSY
PREFILLED_SYRINGE | INTRAVENOUS | Status: DC | PRN
  Administered 2019-03-29: 50 mg via INTRAVENOUS

## 2019-03-29 MED ORDER — PHENYLEPHRINE 40 MCG/ML (10ML) SYRINGE FOR IV PUSH (FOR BLOOD PRESSURE SUPPORT)
PREFILLED_SYRINGE | INTRAVENOUS | Status: AC
  Filled 2019-03-29: qty 10

## 2019-03-29 MED ORDER — OXYCODONE HCL 5 MG PO TABS: 5.0000 mg | ORAL_TABLET | Freq: Once | ORAL | Status: DC | PRN

## 2019-03-29 MED ORDER — ALBUMIN HUMAN 5 % IV SOLN
INTRAVENOUS | Status: DC | PRN
  Administered 2019-03-29: 14:00:00 via INTRAVENOUS

## 2019-03-29 MED ORDER — BUPIVACAINE HCL (PF) 0.25 % IJ SOLN
INTRAMUSCULAR | Status: AC
  Filled 2019-03-29: qty 30

## 2019-03-29 MED ORDER — BACITRACIN ZINC 500 UNIT/GM EX OINT
TOPICAL_OINTMENT | CUTANEOUS | Status: AC
  Filled 2019-03-29: qty 28.35

## 2019-03-29 MED ORDER — ENOXAPARIN SODIUM 40 MG/0.4ML ~~LOC~~ SOLN
40.0000 mg | SUBCUTANEOUS | Status: DC
  Administered 2019-03-30 – 2019-04-04 (×6): 40 mg via SUBCUTANEOUS
  Filled 2019-03-29 (×6): qty 0.4

## 2019-03-29 MED ORDER — FENTANYL CITRATE (PF) 100 MCG/2ML IJ SOLN
INTRAMUSCULAR | Status: DC | PRN
  Administered 2019-03-29 (×2): 25 ug via INTRAVENOUS

## 2019-03-29 MED ORDER — LACTATED RINGERS IV SOLN
INTRAVENOUS | Status: DC
  Administered 2019-03-29: 11:00:00 via INTRAVENOUS

## 2019-03-29 MED ORDER — ACETAMINOPHEN 160 MG/5ML PO SOLN: 325.0000 mg | ORAL | Status: DC | PRN

## 2019-03-29 MED ORDER — FENTANYL CITRATE (PF) 250 MCG/5ML IJ SOLN
INTRAMUSCULAR | Status: AC
  Filled 2019-03-29: qty 5

## 2019-03-29 MED ORDER — SODIUM CHLORIDE 0.9 % IV SOLN
INTRAVENOUS | Status: DC
  Administered 2019-03-30: 05:00:00 via INTRAVENOUS

## 2019-03-29 MED ORDER — LIDOCAINE 2% (20 MG/ML) 5 ML SYRINGE
INTRAMUSCULAR | Status: DC | PRN
  Administered 2019-03-29: 100 mg via INTRAVENOUS

## 2019-03-29 MED ORDER — 0.9 % SODIUM CHLORIDE (POUR BTL) OPTIME
TOPICAL | Status: DC | PRN
  Administered 2019-03-29: 1000 mL

## 2019-03-29 MED ORDER — VANCOMYCIN HCL 1000 MG IV SOLR
INTRAVENOUS | Status: DC | PRN
  Administered 2019-03-29: 1000 mg via TOPICAL

## 2019-03-29 MED ORDER — BUPIVACAINE HCL 0.5 % IJ SOLN
INTRAMUSCULAR | Status: DC | PRN
  Administered 2019-03-29: 15 mL

## 2019-03-29 MED ORDER — METHOCARBAMOL 500 MG PO TABS
500.0000 mg | ORAL_TABLET | Freq: Four times a day (QID) | ORAL | Status: DC | PRN
  Administered 2019-03-31 (×2): 500 mg via ORAL
  Filled 2019-03-29 (×2): qty 1

## 2019-03-29 MED ORDER — PROPOFOL 10 MG/ML IV BOLUS
INTRAVENOUS | Status: DC | PRN
  Administered 2019-03-29: 80 mg via INTRAVENOUS
  Administered 2019-03-29 (×3): 20 mg via INTRAVENOUS
  Administered 2019-03-29: 30 mg via INTRAVENOUS

## 2019-03-29 MED ORDER — OXYCODONE HCL 5 MG/5ML PO SOLN: 5.0000 mg | Freq: Once | ORAL | Status: DC | PRN

## 2019-03-29 MED ORDER — CEFAZOLIN SODIUM-DEXTROSE 2-4 GM/100ML-% IV SOLN
2.0000 g | Freq: Three times a day (TID) | INTRAVENOUS | Status: AC
  Administered 2019-03-29 – 2019-03-30 (×3): 2 g via INTRAVENOUS
  Filled 2019-03-29 (×3): qty 100

## 2019-03-29 MED ORDER — DEXAMETHASONE SODIUM PHOSPHATE 10 MG/ML IJ SOLN
INTRAMUSCULAR | Status: AC
  Filled 2019-03-29: qty 1

## 2019-03-29 MED ORDER — DEXAMETHASONE SODIUM PHOSPHATE 10 MG/ML IJ SOLN
INTRAMUSCULAR | Status: DC | PRN
  Administered 2019-03-29: 4 mg via INTRAVENOUS

## 2019-03-29 MED ORDER — MEPERIDINE HCL 25 MG/ML IJ SOLN: 6.2500 mg | INTRAMUSCULAR | Status: DC | PRN

## 2019-03-29 MED ORDER — ONDANSETRON HCL 4 MG/2ML IJ SOLN
INTRAMUSCULAR | Status: DC | PRN
  Administered 2019-03-29: 4 mg via INTRAVENOUS

## 2019-03-29 MED ORDER — ONDANSETRON HCL 4 MG/2ML IJ SOLN
INTRAMUSCULAR | Status: AC
  Filled 2019-03-29: qty 2

## 2019-03-29 MED ORDER — PHENYLEPHRINE HCL (PRESSORS) 10 MG/ML IV SOLN
INTRAVENOUS | Status: DC | PRN
  Administered 2019-03-29 (×2): 80 ug via INTRAVENOUS

## 2019-03-29 MED ORDER — BUPIVACAINE-EPINEPHRINE (PF) 0.25% -1:200000 IJ SOLN
INTRAMUSCULAR | Status: AC
  Filled 2019-03-29: qty 30

## 2019-03-29 MED ORDER — EPHEDRINE SULFATE 50 MG/ML IJ SOLN
INTRAMUSCULAR | Status: DC | PRN
  Administered 2019-03-29 (×2): 10 mg via INTRAVENOUS

## 2019-03-29 MED ORDER — SUGAMMADEX SODIUM 200 MG/2ML IV SOLN
INTRAVENOUS | Status: DC | PRN
  Administered 2019-03-29: 165 mg via INTRAVENOUS

## 2019-03-29 MED ORDER — ACETAMINOPHEN 325 MG PO TABS: 325.0000 mg | ORAL_TABLET | ORAL | Status: DC | PRN

## 2019-03-29 MED ORDER — ROCURONIUM BROMIDE 10 MG/ML (PF) SYRINGE
PREFILLED_SYRINGE | INTRAVENOUS | Status: AC
  Filled 2019-03-29: qty 10

## 2019-03-29 SURGICAL SUPPLY — 67 items
BANDAGE ESMARK 6X9 LF (GAUZE/BANDAGES/DRESSINGS) ×1 IMPLANT
BIT DRILL 2.5X110 QC LCP DISP (BIT) ×2 IMPLANT
BIT DRILL LCP QC 2X140 (BIT) ×2 IMPLANT
BNDG COHESIVE 4X5 TAN STRL (GAUZE/BANDAGES/DRESSINGS) ×3 IMPLANT
BNDG ELASTIC 4X5.8 VLCR STR LF (GAUZE/BANDAGES/DRESSINGS) ×2 IMPLANT
BNDG ELASTIC 6X10 VLCR STRL LF (GAUZE/BANDAGES/DRESSINGS) ×2 IMPLANT
BNDG ELASTIC 6X5.8 VLCR STR LF (GAUZE/BANDAGES/DRESSINGS) IMPLANT
BNDG ESMARK 6X9 LF (GAUZE/BANDAGES/DRESSINGS) ×3
BRUSH SCRUB EZ PLAIN DRY (MISCELLANEOUS) ×6 IMPLANT
CHLORAPREP W/TINT 26 (MISCELLANEOUS) ×3 IMPLANT
COVER SURGICAL LIGHT HANDLE (MISCELLANEOUS) ×3 IMPLANT
COVER WAND RF STERILE (DRAPES) ×1 IMPLANT
DRAPE C-ARM 42X72 X-RAY (DRAPES) ×3 IMPLANT
DRAPE C-ARMOR (DRAPES) ×3 IMPLANT
DRAPE EXTREMITY T 121X128X90 (DISPOSABLE) ×2 IMPLANT
DRAPE ORTHO SPLIT 77X108 STRL (DRAPES) ×4
DRAPE SURG ORHT 6 SPLT 77X108 (DRAPES) ×2 IMPLANT
DRAPE U-SHAPE 47X51 STRL (DRAPES) ×3 IMPLANT
DRSG ADAPTIC 3X8 NADH LF (GAUZE/BANDAGES/DRESSINGS) ×2 IMPLANT
ELECT REM PT RETURN 9FT ADLT (ELECTROSURGICAL) ×3
ELECTRODE REM PT RTRN 9FT ADLT (ELECTROSURGICAL) ×1 IMPLANT
GAUZE SPONGE 4X4 12PLY STRL (GAUZE/BANDAGES/DRESSINGS) IMPLANT
GAUZE SPONGE 4X4 16PLY XRAY LF (GAUZE/BANDAGES/DRESSINGS) ×2 IMPLANT
GLOVE BIO SURGEON STRL SZ 6.5 (GLOVE) ×6 IMPLANT
GLOVE BIO SURGEON STRL SZ7.5 (GLOVE) ×9 IMPLANT
GLOVE BIO SURGEONS STRL SZ 6.5 (GLOVE) ×3
GLOVE BIOGEL PI IND STRL 6.5 (GLOVE) ×1 IMPLANT
GLOVE BIOGEL PI IND STRL 7.5 (GLOVE) ×1 IMPLANT
GLOVE BIOGEL PI INDICATOR 6.5 (GLOVE) ×2
GLOVE BIOGEL PI INDICATOR 7.5 (GLOVE) ×2
GOWN STRL REUS W/ TWL LRG LVL3 (GOWN DISPOSABLE) ×2 IMPLANT
GOWN STRL REUS W/TWL LRG LVL3 (GOWN DISPOSABLE) ×4
KIT TURNOVER KIT B (KITS) ×3 IMPLANT
MANIFOLD NEPTUNE II (INSTRUMENTS) ×3 IMPLANT
NDL HYPO 21X1.5 SAFETY (NEEDLE) IMPLANT
NDL HYPO 25GX1X1/2 BEV (NEEDLE) ×1 IMPLANT
NEEDLE HYPO 21X1.5 SAFETY (NEEDLE) IMPLANT
NEEDLE HYPO 25GX1X1/2 BEV (NEEDLE) ×3 IMPLANT
NS IRRIG 1000ML POUR BTL (IV SOLUTION) ×3 IMPLANT
PACK TOTAL JOINT (CUSTOM PROCEDURE TRAY) ×3 IMPLANT
PAD ARMBOARD 7.5X6 YLW CONV (MISCELLANEOUS) ×6 IMPLANT
PAD CAST 4YDX4 CTTN HI CHSV (CAST SUPPLIES) IMPLANT
PADDING CAST COTTON 4X4 STRL (CAST SUPPLIES) ×2
PADDING CAST COTTON 6X4 STRL (CAST SUPPLIES) ×2 IMPLANT
PLATE BONE DISTAL TIBIAL H8 LT (Plate) ×2 IMPLANT
SCREW CORTEX LOW PRO 3.5X28 (Screw) ×6 IMPLANT
SCREW CORTEX PROFILE 3.5X40 (Screw) ×2 IMPLANT
SCREW LOCKING 2.7X16MM VA (Screw) ×2 IMPLANT
SCREW LOCKING VA 2.7X42 (Screw) ×2 IMPLANT
SCREW LOCKING VA 2.7X46 (Screw) ×6 IMPLANT
SCREW LOCKING VA 2.7X50MM (Screw) ×2 IMPLANT
SCREW METAPHYSCAL 46MM (Screw) ×2 IMPLANT
SPONGE LAP 18X18 RF (DISPOSABLE) IMPLANT
STAPLER VISISTAT 35W (STAPLE) ×3 IMPLANT
SUCTION FRAZIER HANDLE 10FR (MISCELLANEOUS) ×2
SUCTION TUBE FRAZIER 10FR DISP (MISCELLANEOUS) ×1 IMPLANT
SUT ETHILON 3 0 PS 1 (SUTURE) ×6 IMPLANT
SUT PROLENE 0 CT (SUTURE) IMPLANT
SUT VIC AB 0 CT1 27 (SUTURE) ×2
SUT VIC AB 0 CT1 27XBRD ANBCTR (SUTURE) ×1 IMPLANT
SUT VIC AB 2-0 CT1 27 (SUTURE) ×4
SUT VIC AB 2-0 CT1 TAPERPNT 27 (SUTURE) ×2 IMPLANT
SYR CONTROL 10ML LL (SYRINGE) ×3 IMPLANT
TOWEL GREEN STERILE (TOWEL DISPOSABLE) ×6 IMPLANT
TOWEL GREEN STERILE FF (TOWEL DISPOSABLE) ×3 IMPLANT
UNDERPAD 30X30 (UNDERPADS AND DIAPERS) ×3 IMPLANT
WATER STERILE IRR 1000ML POUR (IV SOLUTION) ×3 IMPLANT

## 2019-03-29 NOTE — Anesthesia Procedure Notes (Signed)
Procedure Name: Intubation Date/Time: 03/29/2019 1:33 PM Performed by: Scheryl Darter, CRNA Pre-anesthesia Checklist: Patient identified, Emergency Drugs available, Suction available and Patient being monitored Patient Re-evaluated:Patient Re-evaluated prior to induction Oxygen Delivery Method: Circle System Utilized Preoxygenation: Pre-oxygenation with 100% oxygen Induction Type: IV induction Ventilation: Mask ventilation without difficulty Laryngoscope Size: Mac and 3 Tube type: Oral Tube size: 7.5 mm Number of attempts: 1 Airway Equipment and Method: Stylet and Oral airway Placement Confirmation: ETT inserted through vocal cords under direct vision,  positive ETCO2 and breath sounds checked- equal and bilateral Secured at: 22 cm Tube secured with: Tape Dental Injury: Teeth and Oropharynx as per pre-operative assessment

## 2019-03-29 NOTE — Progress Notes (Signed)
Came back to see patient, still not in room-- will follow up in AM [facility]
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
## 2019-03-29 NOTE — Progress Notes (Signed)
Came to see patient, already in surgery-- will try again later Eulogio Bear DO

## 2019-03-29 NOTE — Anesthesia Postprocedure Evaluation (Signed)
Anesthesia Post Note  Patient: Evia Hjort  Procedure(s) Performed: OPEN REDUCTION INTERNAL FIXATION (ORIF) DISTAL TIBIA/FIBULA FRACTURE (Left )     Patient location during evaluation: PACU Anesthesia Type: General Level of consciousness: awake and alert Pain management: pain level controlled Vital Signs Assessment: post-procedure vital signs reviewed and stable Respiratory status: spontaneous breathing, nonlabored ventilation, respiratory function stable and patient connected to nasal cannula oxygen Cardiovascular status: blood pressure returned to baseline and stable Postop Assessment: no apparent nausea or vomiting Anesthetic complications: no    Last Vitals:  Vitals:   03/29/19 1545 03/29/19 1600  BP:  (!) 145/77  Pulse: 82 81  Resp: 19   Temp: (!) 36.3 C (!) 36.3 C  SpO2: 97% (!) 87%    Last Pain:  Vitals:   03/29/19 1721  TempSrc:   PainSc: 9                  Tiajuana Amass

## 2019-03-29 NOTE — Anesthesia Preprocedure Evaluation (Addendum)
Anesthesia Evaluation  Patient identified by MRN, date of birth, ID band Patient awake    Reviewed: Allergy & Precautions, H&P , NPO status , Patient's Chart, lab work & pertinent test results, reviewed documented beta blocker date and time   Airway Mallampati: II  TM Distance: >3 FB Neck ROM: full    Dental no notable dental hx. (+) Edentulous Upper, Edentulous Lower   Pulmonary neg pulmonary ROS, pneumonia, unresolved,    Pulmonary exam normal breath sounds clear to auscultation       Cardiovascular Exercise Tolerance: Good negative cardio ROS Normal cardiovascular exam Rhythm:regular Rate:Normal     Neuro/Psych negative neurological ROS  negative psych ROS   GI/Hepatic negative GI ROS, Neg liver ROS,   Endo/Other  negative endocrine ROS  Renal/GU negative Renal ROS  negative genitourinary   Musculoskeletal negative musculoskeletal ROS (+)   Abdominal   Peds negative pediatric ROS (+)  Hematology negative hematology ROS (+)   Anesthesia Other Findings   Reproductive/Obstetrics negative OB ROS                            Anesthesia Physical Anesthesia Plan  ASA: II  Anesthesia Plan: General   Post-op Pain Management:    Induction: Intravenous  PONV Risk Score and Plan: 3 and Ondansetron, Dexamethasone and Treatment may vary due to age or medical condition  Airway Management Planned: Oral ETT and LMA  Additional Equipment:   Intra-op Plan:   Post-operative Plan: Extubation in OR  Informed Consent: I have reviewed the patients History and Physical, chart, labs and discussed the procedure including the risks, benefits and alternatives for the proposed anesthesia with the patient or authorized representative who has indicated his/her understanding and acceptance.     Dental Advisory Given  Plan Discussed with: CRNA, Anesthesiologist and Surgeon  Anesthesia Plan Comments:  (  )        Anesthesia Quick Evaluation

## 2019-03-29 NOTE — Plan of Care (Signed)
  Problem: Safety: Goal: Ability to remain free from injury will improve Outcome: Progressing   Problem: Pain Managment: Goal: General experience of comfort will improve Outcome: Progressing   
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
## 2019-03-29 NOTE — Transfer of Care (Signed)
Immediate Anesthesia Transfer of Care Note  Patient: Darlene Santos  Procedure(s) Performed: OPEN REDUCTION INTERNAL FIXATION (ORIF) DISTAL TIBIA/FIBULA FRACTURE (Left )  Patient Location: PACU  Anesthesia Type:General  Level of Consciousness: drowsy and responds to stimulation  Airway & Oxygen Therapy: Patient Spontanous Breathing and Patient connected to face mask oxygen  Post-op Assessment: Report given to RN and Post -op Vital signs reviewed and stable  Post vital signs: Reviewed and stable  Last Vitals:  Vitals Value Taken Time  BP 154/84 03/29/19 1449  Temp    Pulse 90 03/29/19 1451  Resp 20 03/29/19 1451  SpO2 100 % 03/29/19 1451  Vitals shown include unvalidated device data.  Last Pain:  Vitals:   03/29/19 1040  TempSrc:   PainSc: 8       Patients Stated Pain Goal: 0 ([id number])  Complications: No apparent anesthesia complications

## 2019-03-29 NOTE — Progress Notes (Signed)
Orthopedic Tech Progress Note Patient Details:  Darlene Santos 09-Apr-1930 [id number]  Ortho Devices Type of Ortho Device: CAM walker Ortho Device/Splint Location: delivered to or. Ortho Device/Splint Interventions: [name] 03/29/2019, 2:43 PM

## 2019-03-30 LAB — CK: Total CK: 837 U/L — ABNORMAL HIGH (ref 38–234)

## 2019-03-30 MED ORDER — SENNOSIDES-DOCUSATE SODIUM 8.6-50 MG PO TABS: 1.0000 | ORAL_TABLET | Freq: Every evening | ORAL | Status: DC | PRN

## 2019-03-30 MED ORDER — OXYCODONE HCL 5 MG PO TABS
5.0000 mg | ORAL_TABLET | ORAL | Status: DC | PRN
  Administered 2019-03-31 – 2019-04-02 (×5): 5 mg via ORAL
  Filled 2019-03-30 (×5): qty 1

## 2019-03-30 MED ORDER — ACETAMINOPHEN 325 MG PO TABS: 650.0000 mg | ORAL_TABLET | Freq: Four times a day (QID) | ORAL | Status: DC | PRN

## 2019-03-30 MED ORDER — DOCUSATE SODIUM 100 MG PO CAPS
100.0000 mg | ORAL_CAPSULE | Freq: Two times a day (BID) | ORAL | Status: DC
  Administered 2019-03-30 – 2019-04-04 (×12): 100 mg via ORAL
  Filled 2019-03-30 (×12): qty 1

## 2019-03-30 MED ORDER — GABAPENTIN 100 MG PO CAPS
100.0000 mg | ORAL_CAPSULE | Freq: Every day | ORAL | Status: DC
  Administered 2019-03-31 – 2019-04-04 (×5): 100 mg via ORAL
  Filled 2019-03-30 (×5): qty 1

## 2019-03-30 MED ORDER — ACETAMINOPHEN 500 MG PO TABS
1000.0000 mg | ORAL_TABLET | Freq: Three times a day (TID) | ORAL | Status: DC
  Administered 2019-03-30 – 2019-04-04 (×18): 1000 mg via ORAL
  Filled 2019-03-30 (×20): qty 2
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
## 2019-03-31 ENCOUNTER — Inpatient Hospital Stay (HOSPITAL_COMMUNITY): Payer: Medicare HMO

## 2019-03-31 MED ORDER — DICLOFENAC SODIUM 1 % TD GEL
2.0000 g | Freq: Four times a day (QID) | TRANSDERMAL | Status: DC
  Administered 2019-03-31 – 2019-04-04 (×15): 2 g via TOPICAL
  Filled 2019-03-31: qty 100
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
## 2019-03-31 NOTE — Plan of Care (Signed)

## 2019-03-31 NOTE — Plan of Care (Signed)
  Problem: Pain Managment: Goal: General experience of comfort will improve Outcome: Progressing   

## 2019-04-01 LAB — BASIC METABOLIC PANEL
Anion gap: 9 (ref 5–15)
BUN: 11 mg/dL (ref 8–23)
CO2: 26 mmol/L (ref 22–32)
Calcium: 8.3 mg/dL — ABNORMAL LOW (ref 8.9–10.3)
Chloride: 103 mmol/L (ref 98–111)
Creatinine, Ser: 0.63 mg/dL (ref 0.44–1.00)
GFR calc Af Amer: >60 mL/min (ref >60)
GFR calc non Af Amer: >60 mL/min (ref >60)
Glucose, Bld: 105 mg/dL — ABNORMAL HIGH (ref 70–99)
Potassium: 3.7 mmol/L (ref 3.5–5.1)
Sodium: 138 mmol/L (ref 135–145)

## 2019-04-01 LAB — CBC
HCT: 32.5 % — ABNORMAL LOW (ref 36.0–46.0)
Hemoglobin: 10.6 g/dL — ABNORMAL LOW (ref 12.0–15.0)
MCH: 30 pg (ref 26.0–34.0)
MCHC: 32.6 g/dL (ref 30.0–36.0)
MCV: 92.1 fL (ref 80.0–100.0)
Platelets: 202 10*3/uL (ref 150–400)
RBC: 3.53 MIL/uL — ABNORMAL LOW (ref 3.87–5.11)
RDW: 13 % (ref 11.5–15.5)
WBC: 7.2 10*3/uL (ref 4.0–10.5)
nRBC: 0 % (ref 0.0–0.2)

## 2019-04-01 LAB — CK: Total CK: 513 U/L — ABNORMAL HIGH (ref 38–234)

## 2019-04-01 LAB — VITAMIN D 25 HYDROXY (VIT D DEFICIENCY, FRACTURES): Vit D, 25-Hydroxy: 33.3 ng/mL (ref 30.0–100.0)

## 2019-04-01 MED ORDER — LIDOCAINE 5 % EX PTCH
1.0000 | MEDICATED_PATCH | CUTANEOUS | Status: DC
  Administered 2019-04-01 – 2019-04-04 (×4): 1 via TRANSDERMAL
  Filled 2019-04-01 (×4): qty 1
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
## 2019-04-01 NOTE — Progress Notes (Signed)
Inpatient Rehabilitation Admissions Coordinator  Inpatient rehab consult received. I met with patient with her daughter at bedside. Pt is not a candidate for an intense inpt rehab admission. She will need a slower paced program that can be offered at [facility]. Pt and daughter are in agreement. I have notified SW, Michiel Cowboy and will sign off at this time.  Danne Baxter, RN, MSN Rehab Admissions Coordinator [phone] 04/01/2019 2:31 PM

## 2019-04-01 NOTE — Care Management Important Message (Signed)
Important Message  Patient Details  Name: Darlene Santos MRN: [id number] Date of Birth: 1930-04-08   Medicare Important Message Given:  Yes     Memory Argue 04/01/2019, 4:22 PM

## 2019-04-01 NOTE — TOC Initial Note (Signed)
Transition of Care [facility]) - Initial/Assessment Note    Patient Details  Name: Darlene Santos MRN: [id number] Date of Birth: 11-12-1929  Transition of Care [facility]) CM/SW Contact:    Darlene Santos, [place] Phone Number: 04/01/2019, 11:08 AM  Clinical Narrative:                 CSW spoke with pt at bedside. Introduced self, role reason for visit. Pt from home with her husband Darlene Santos, she is hard of hearing but once the volume of voice is elevated she appears to understand CSW.   CSW confirmed home address and other supports which include a daughter Darlene Santos who is coming down from [place]. CSW discussed prior level of function with pt; her report is that she was independent and driving prior to admission.   We discussed therapy recommendations and that pt is requiring max assist with ADLs and IADLs per last therapy evaluation. We discussed that inpatient rehab here at [facility] vs SNF in the community closer to [facility] may be helpful for pt as she recovers. Pt does not seem to understand the amount of assistance she needs and keeps repeating that she has "three doctors in her family that will help her." Pt does not recall who came to see her from her care team this morning.   Due to hearing limitations and being unable to assess whether pt will have assistance at home CSW requested to call pt home phone to speak with husband and daughter about recommendations.  Pt refuses. Pt insistent on CSW speaking with pt family when they come to visit. CSW let pt know that someone from CIR may visit to assess her for services here and that therapy will come by to reassess her current needs.   CSW and pt RN Darlene Santos discussed pt desire to have CSW come by and see pt family when they come. He will alert CSW if family comes by during [place] work day.   Expected Discharge Plan: Skilled Nursing Facility Barriers to Discharge: [place] (PASRR), Continued Medical Work up, Ship broker   Patient  Goals and CMS Choice Patient states their goals for this hospitalization and ongoing recovery are:: to go home   Choice offered to / list presented to : Patient, Adult Children  Expected Discharge Plan and Services Expected Discharge Plan: [place] In-house Referral: Clinical Social Work Discharge Planning Services: CM Consult Post Acute Care Choice: IP Rehab, Skilled Nursing Facility(TBD following therapy eval) Living arrangements for the past 2 months: Single Family Home Expected Discharge Date: 04/03/19                  Prior Living Arrangements/Services Living arrangements for the past 2 months: Single Family Home Lives with:: Spouse Patient language and need for interpreter reviewed:: Yes(no needs) Do you feel safe going back to the place where you live?: Yes      Need for Family Participation in Patient Care: Yes (Comment)(assistance with ADL and IADLs; supervision) Care giver support system in place?: Yes (comment)(adult children; husband) Current home services: DME Criminal Activity/Legal Involvement Pertinent to Current Situation/Hospitalization: No - Comment as needed  Activities of Daily Living Home Assistive Devices/Equipment: Cane (specify quad or straight)(straight) ADL Screening (condition at time of admission) Patient's cognitive ability adequate to safely complete daily activities?: Yes Is the patient deaf or have difficulty hearing?: Yes Does the patient have difficulty seeing, even when wearing glasses/contacts?: No Does the patient have difficulty concentrating, remembering, or making decisions?: No Patient  able to express need for assistance with ADLs?: Yes Does the patient have difficulty dressing or bathing?: No Independently performs ADLs?: Yes (appropriate for developmental age) Does the patient have difficulty walking or climbing stairs?: No Weakness of Legs: Left Weakness of Arms/Hands: None  Permission Sought/Granted Permission sought  to share information with : Family Supports Permission granted to share information with : No(declines to let CSW call daughter or husband at this time. requests I speak with them when they visit.)  Share Information with NAME: Darlene Santos; Darlene Santos           Emotional Assessment Appearance:: Appears stated age Attitude/Demeanor/Rapport: Inconsistent, Engaged, Guarded Affect (typically observed): Defensive, Apprehensive, Pleasant Orientation: : Oriented to Self, Oriented to Place, Oriented to  Time, Oriented to Situation, Fluctuating Orientation (Suspected and/or reported Sundowners) Alcohol / Substance Use: Not Applicable Psych Involvement: No (comment)  Admission diagnosis:  Left distal tibia fracture Patient Active Problem List   Diagnosis Date Noted  . Elevated CK 03/29/2019  . Elective surgery   . Tibia/fibula fracture, left, closed, initial encounter 03/28/2019   PCP:  Lavone Orn, MD Pharmacy:   [facility] [address] [place] - [place] [address] [place] [address] Phone: [phone] Fax: [phone]     Social Determinants of Health (SDOH) Interventions    Readmission Risk Interventions No flowsheet data found.
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
## 2019-04-01 NOTE — TOC Progression Note (Signed)
Transition of Care [facility]) - Progression Note    Patient Details  Name: Darlene Santos MRN: [id number] Date of Birth: [birth year]  Transition of Care [facility]) CM/SW [place], [place] Phone Number: 04/01/2019, 5:17 PM  Clinical Narrative:    CSW met with pt and pt daughter Sebastian Ache at bedside. Pt daughter is in town from [place] but feels that pt needs more assistance than she and pt husband can provide at home and prefer we look into a SNF placement. She is open to [place] area SNFs. It was discussed multiple times with pt that she will not have visitors and she seems to understand. Pt daughter and CSW discussed coverage under Medicare plan/supplement so long as it is medically necessary for pt to be at SNF.   Pt daughter has CMS list for [place] area SNFs; will bring bed offers when available.    Expected Discharge Plan: Skilled Nursing Facility Barriers to Discharge: [place] (PASRR), Continued Medical Work up, Orthoptist and Services Expected Discharge Plan: [place] In-house Referral: Clinical Social Work Discharge Planning Services: CM Consult Post Acute Care Choice: IP Rehab, Skilled Nursing Facility(TBD following therapy eval) Living arrangements for the past 2 months: Single Family Home Expected Discharge Date: 04/03/19                                     Social Determinants of Health (SDOH) Interventions    Readmission Risk Interventions No flowsheet data found.

## 2019-04-01 NOTE — Plan of Care (Signed)

## 2019-04-02 ENCOUNTER — Encounter (HOSPITAL_COMMUNITY): Payer: Self-pay | Admitting: Student

## 2019-04-02 DIAGNOSIS — T148XXA Other injury of unspecified body region, initial encounter: Secondary | ICD-10-CM

## 2019-04-02 DIAGNOSIS — R748 Abnormal levels of other serum enzymes: Secondary | ICD-10-CM

## 2019-04-02 LAB — HEPATITIS PANEL, ACUTE
HCV Ab: 0.2 s/co ratio (ref 0.0–0.9)
Hep A IgM: NEGATIVE
Hep B C IgM: NEGATIVE
Hepatitis B Surface Ag: NEGATIVE

## 2019-04-02 MED ORDER — OXYCODONE HCL 5 MG PO TABS: 5.0000 mg | ORAL_TABLET | ORAL | 0 refills | Status: AC | PRN

## 2019-04-02 MED ORDER — DOCUSATE SODIUM 100 MG PO CAPS: 100.0000 mg | ORAL_CAPSULE | Freq: Two times a day (BID) | ORAL | 0 refills | Status: DC

## 2019-04-02 MED ORDER — AMLODIPINE BESYLATE 5 MG PO TABS
5.0000 mg | ORAL_TABLET | Freq: Every day | ORAL | Status: DC
  Administered 2019-04-03 – 2019-04-04 (×2): 5 mg via ORAL
  Filled 2019-04-02 (×3): qty 1

## 2019-04-02 MED ORDER — METHOCARBAMOL 500 MG PO TABS: 500.0000 mg | ORAL_TABLET | Freq: Four times a day (QID) | ORAL | Status: DC | PRN

## 2019-04-02 MED ORDER — ACETAMINOPHEN 500 MG PO TABS: 1000.0000 mg | ORAL_TABLET | Freq: Three times a day (TID) | ORAL | 0 refills | Status: DC

## 2019-04-02 MED ORDER — ENOXAPARIN SODIUM 40 MG/0.4ML ~~LOC~~ SOLN: 40.0000 mg | SUBCUTANEOUS | 0 refills | Status: DC

## 2019-04-02 MED ORDER — GABAPENTIN 100 MG PO CAPS: 100.0000 mg | ORAL_CAPSULE | Freq: Every day | ORAL | Status: DC

## 2019-04-02 MED ORDER — LIDOCAINE 5 % EX PTCH: 1.0000 | MEDICATED_PATCH | CUTANEOUS | 0 refills | Status: AC

## 2019-04-02 MED ORDER — SENNOSIDES-DOCUSATE SODIUM 8.6-50 MG PO TABS: 1.0000 | ORAL_TABLET | Freq: Every evening | ORAL | Status: DC | PRN

## 2019-04-02 MED ORDER — AMLODIPINE BESYLATE 5 MG PO TABS: 5.0000 mg | ORAL_TABLET | Freq: Every day | ORAL | Status: DC

## 2019-04-02 MED ORDER — DICLOFENAC SODIUM 1 % TD GEL: 2.0000 g | Freq: Four times a day (QID) | TRANSDERMAL | Status: DC

## 2019-04-02 NOTE — Plan of Care (Signed)

## 2019-04-02 NOTE — TOC Progression Note (Addendum)
Transition of Care [facility]) - Progression Note    Patient Details  Name: Darlene Santos MRN: [id number] Date of Birth: [birth year]  Transition of Care [facility]) CM/SW [place], [place] Phone Number: 04/02/2019, 2:40 PM  Clinical Narrative:    Pt daughter and CSW spoke regarding plan of care. Pt daughter and father have chosen to take pt [facility] with [facility]. Pt daughter was emailed [place] at her request to [email or web address] and Nira Conn, RNCM will assist CSW with equipment order.  Appointment made with pt PCP Lavone Orn, MD at Sept. 10th; 1:30pm.    Expected Discharge Plan: Skilled Nursing Facility Barriers to Discharge: [place] [facility]), Continued Medical Work up, Orthoptist and Services Expected Discharge Plan: [place] In-house Referral: Clinical Social Work Discharge Planning Services: CM Consult Post Acute Care Choice: IP Rehab, Skilled Nursing Facility(TBD following therapy eval) Living arrangements for the past 2 months: Single Family Home Expected Discharge Date: 04/02/19                 Social Determinants of Health (SDOH) Interventions    Readmission Risk Interventions No flowsheet data found.
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
## 2019-04-02 NOTE — Discharge Instructions (Signed)
Orthopaedic Trauma Service Discharge Instructions   General Discharge Instructions  WEIGHT BEARING STATUS: Non-weightbearing left leg  RANGE OF MOTION/ACTIVITY: Okay to come out of boot for gentle ankle range of motion  Wound Care: Okay to remove dressing once you arrive home.  Incisions can be left open to air if there is no drainage. If incision continues to have drainage, follow wound care instructions below. Okay to shower if no drainage from incisions.  DVT/PE prophylaxis: Lovenox  Diet: as you were eating previously.  Can use over the counter stool softeners and bowel preparations, such as Miralax, to help with bowel movements.  Narcotics can be constipating.  Be sure to drink plenty of fluids  PAIN MEDICATION USE AND EXPECTATIONS  You have likely been given narcotic medications to help control your pain.  After a traumatic event that results in an fracture (broken bone) with or without surgery, it is ok to use narcotic pain medications to help control one's pain.  We understand that everyone responds to pain differently and each individual patient will be evaluated on a regular basis for the continued need for narcotic medications. Ideally, narcotic medication use should last no more than 6-8 weeks (coinciding with fracture healing).   As a patient it is your responsibility as well to monitor narcotic medication use and report the amount and frequency you use these medications when you come to your office visit.   We would also advise that if you are using narcotic medications, you should take a dose prior to therapy to maximize you participation.  IF YOU ARE ON NARCOTIC MEDICATIONS IT IS NOT PERMISSIBLE TO OPERATE A MOTOR VEHICLE (MOTORCYCLE/CAR/TRUCK/MOPED) OR HEAVY MACHINERY DO NOT MIX NARCOTICS WITH OTHER CNS (CENTRAL NERVOUS SYSTEM) DEPRESSANTS SUCH AS ALCOHOL   STOP SMOKING OR USING NICOTINE PRODUCTS!!!!  As discussed nicotine severely impairs your body's ability to heal  surgical and traumatic wounds but also impairs bone healing.  Wounds and bone heal by forming microscopic blood vessels (angiogenesis) and nicotine is a vasoconstrictor (essentially, shrinks blood vessels).  Therefore, if vasoconstriction occurs to these microscopic blood vessels they essentially disappear and are unable to deliver necessary nutrients to the healing tissue.  This is one modifiable factor that you can do to dramatically increase your chances of healing your injury.    (This means no smoking, no nicotine gum, patches, etc)  DO NOT USE NONSTEROIDAL ANTI-INFLAMMATORY DRUGS (NSAID'S)  Using products such as Advil (ibuprofen), Aleve (naproxen), Motrin (ibuprofen) for additional pain control during fracture healing can delay and/or prevent the healing response.  If you would like to take over the counter (OTC) medication, Tylenol (acetaminophen) is ok.  However, some narcotic medications that are given for pain control contain acetaminophen as well. Therefore, you should not exceed more than 4000 mg of tylenol in a day if you do not have liver disease.  Also note that there are may OTC medicines, such as cold medicines and allergy medicines that my contain tylenol as well.  If you have any questions about medications and/or interactions please ask your doctor/PA or your pharmacist.      ICE AND ELEVATE INJURED/OPERATIVE EXTREMITY  Using ice and elevating the injured extremity above your heart can help with swelling and pain control.  Icing in a pulsatile fashion, such as 20 minutes on and 20 minutes off, can be followed.    Do not place ice directly on skin. Make sure there is a barrier between to skin and the ice pack.  Using frozen items such as frozen peas works well as the conform nicely to the are that needs to be iced.  USE AN ACE WRAP OR TED HOSE FOR SWELLING CONTROL  In addition to icing and elevation, Ace wraps or TED hose are used to help limit and resolve swelling.  It is  recommended to use Ace wraps or TED hose until you are informed to stop.    When using Ace Wraps start the wrapping distally (farthest away from the body) and wrap proximally (closer to the body)   Example: If you had surgery on your leg or thing and you do not have a splint on, start the ace wrap at the toes and work your way up to the thigh        If you had surgery on your upper extremity and do not have a splint on, start the ace wrap at your fingers and work your way up to the upper arm    IF YOU ARE IN A CAM BOOT (BLACK BOOT)  You may remove boot periodically. Perform daily dressing changes as noted below.  Wash the liner of the boot regularly and wear a sock when wearing the boot. It is recommended that you sleep in the boot until told otherwise   CALL THE OFFICE WITH ANY QUESTIONS OR CONCERNS: [phone]   VISIT OUR WEBSITE FOR ADDITIONAL INFORMATION: [email or web address]    Discharge Wound Care Instructions  Do NOT apply any ointments, solutions or lotions to pin sites or surgical wounds.  These prevent needed drainage and even though solutions like hydrogen peroxide kill bacteria, they also damage cells lining the pin sites that help fight infection.  Applying lotions or ointments can keep the wounds moist and can cause them to breakdown and open up as well. This can increase the risk for infection. When in doubt call the office.  Surgical incisions should be dressed daily.  If any drainage is noted, use one layer of adaptic, then gauze, Kerlix, and an ace wrap.  Once the incision is completely dry and without drainage, it may be left open to air out.  Showering may begin 36-48 hours later.  Cleaning gently with soap and water.  Traumatic wounds should be dressed daily as well.    One layer of adaptic, gauze, Kerlix, then ace wrap.  The adaptic can be discontinued once the draining has ceased    If you have a wet to dry dressing: wet the gauze with saline the squeeze as much  saline out so the gauze is moist (not soaking wet), place moistened gauze over wound, then place a dry gauze over the moist one, followed by Kerlix wrap, then ace wrap.
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
## 2019-04-03 LAB — CBC
HCT: 31.7 % — ABNORMAL LOW (ref 36.0–46.0)
Hemoglobin: 10.3 g/dL — ABNORMAL LOW (ref 12.0–15.0)
MCH: 30.2 pg (ref 26.0–34.0)
MCHC: 32.5 g/dL (ref 30.0–36.0)
MCV: 93 fL (ref 80.0–100.0)
Platelets: 237 10*3/uL (ref 150–400)
RBC: 3.41 MIL/uL — ABNORMAL LOW (ref 3.87–5.11)
RDW: 13 % (ref 11.5–15.5)
WBC: 8 10*3/uL (ref 4.0–10.5)
nRBC: 0 % (ref 0.0–0.2)

## 2019-04-03 LAB — BASIC METABOLIC PANEL
Anion gap: 11 (ref 5–15)
BUN: 10 mg/dL (ref 8–23)
CO2: 27 mmol/L (ref 22–32)
Calcium: 8.4 mg/dL — ABNORMAL LOW (ref 8.9–10.3)
Chloride: 101 mmol/L (ref 98–111)
Creatinine, Ser: 0.65 mg/dL (ref 0.44–1.00)
GFR calc Af Amer: >60 mL/min (ref >60)
GFR calc non Af Amer: >60 mL/min (ref >60)
Glucose, Bld: 115 mg/dL — ABNORMAL HIGH (ref 70–99)
Potassium: 4 mmol/L (ref 3.5–5.1)
Sodium: 139 mmol/L (ref 135–145)

## 2019-04-03 LAB — CK: Total CK: 213 U/L (ref 38–234)

## 2019-04-03 NOTE — TOC Progression Note (Signed)
Transition of Care [facility]) - Progression Note    Patient Details  Name: Darlene Santos MRN: [id number] Date of Birth: 20-Apr-1930  Transition of Care [facility]) CM/SW [place], [place] Phone Number: 04/03/2019, 12:39 PM  Clinical Narrative:    Called pt daughter Sebastian Ache; pt daughter and husband requesting hospital bed, wheelchair, walker, and 3 in 1. Their top choices for HH are [facility], [facility] and [facility].   Called referral to [place] at [facility]; Heather RNCM to assist with equipment order.    Expected Discharge Plan: [place] Barriers to Discharge: [place] (PASRR), Continued Medical Work up, Orthoptist and Services Expected Discharge Plan: [place] In-house Referral: Clinical Social Work Discharge Planning Services: CM Consult Post Acute Care Choice: Durable Medical Equipment, Home Health Living arrangements for the past 2 months: Single Family Home Expected Discharge Date: 04/02/19               DME Arranged: 3-N-1, [place]ford Rile rolling, Hospital bed, Wheelchair manual DME Agency: [facility]       HH Arranged: PT, OT, RN [place] Agency: [facility] Date [facility] Agency Contacted: 04/03/19 Time [place]: 1238 Representative spoke with at [place]: [place] (SDOH) Interventions    Readmission Risk Interventions No flowsheet data found.
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
## 2019-04-03 NOTE — Plan of Care (Signed)
  Problem: Safety: Goal: Ability to remain free from injury will improve Outcome: Progressing   Problem: Pain Managment: Goal: General experience of comfort will improve Outcome: Progressing   

## 2019-04-03 NOTE — Plan of Care (Signed)
  Problem: Activity: Goal: Risk for activity intolerance will decrease Outcome: Progressing   Problem: Pain Managment: Goal: General experience of comfort will improve Outcome: Progressing   Problem: Safety: Goal: Ability to remain free from injury will improve Outcome: Progressing   
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
## 2019-04-03 NOTE — Care Management (Signed)
Ordered walker, 3 in 1 , wheel chair , and hospital bed through [facility] provided daughter contact information for delivery [place] [phone]. Zack with [facility] gave estimated delivery will be after 7 pm tonight.   Magdalen Spatz RN [phone]
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
## 2019-04-04 MED ORDER — METHOCARBAMOL 500 MG PO TABS: 500.0000 mg | ORAL_TABLET | Freq: Four times a day (QID) | ORAL | 0 refills | Status: AC | PRN

## 2019-04-04 MED ORDER — DICLOFENAC SODIUM 1 % TD GEL: 2.0000 g | Freq: Four times a day (QID) | TRANSDERMAL | 0 refills | Status: AC

## 2019-04-04 MED ORDER — AMLODIPINE BESYLATE 5 MG PO TABS: 5.0000 mg | ORAL_TABLET | Freq: Every day | ORAL | 0 refills | Status: AC

## 2019-04-04 MED ORDER — ENOXAPARIN SODIUM 40 MG/0.4ML ~~LOC~~ SOLN: 40.0000 mg | SUBCUTANEOUS | 0 refills | Status: AC

## 2019-04-04 MED ORDER — GABAPENTIN 100 MG PO CAPS: 100.0000 mg | ORAL_CAPSULE | Freq: Every day | ORAL | 0 refills | Status: AC

## 2019-04-04 NOTE — Progress Notes (Signed)
Patient discharging home. Discharge instructions explained to patient and patient's husband including self administration of lovenox injection with return demonstration and they both verbalized understanding. Packed all personal belongings. Awaiting for [facility] to transport.
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
## 2019-04-04 NOTE — Plan of Care (Signed)
  Problem: Activity: Goal: Risk for activity intolerance will decrease Outcome: Progressing   Problem: Pain Managment: Goal: General experience of comfort will improve Outcome: Progressing   Problem: Safety: Goal: Ability to remain free from injury will improve Outcome: Progressing   

## 2019-04-04 NOTE — Discharge Summary (Signed)
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 co-infection with other viruses. If result is PRESUMPTIVE POSTIVE SARS-CoV-2 nucleic acids MAY BE PRESENT.   A presumptive positive result was obtained on the submitted specimen  and confirmed on repeat testing.  While 2019 novel coronavirus  (SARS-CoV-2) nucleic acids may be present in the submitted sample  additional confirmatory testing may be necessary for epidemiological  and / or clinical management purposes  to differentiate between  SARS-CoV-2 and other Sarbecovirus currently known to infect humans.  If clinically indicated additional testing with an alternate test  methodology [phone]) is advised. The SARS-CoV-2 RNA is generally  detectable in upper and lower respiratory sp ecimens during the acute  phase of infection. The expected result is  Negative. Fact Sheet for Patients:  StrictlyIdeas.no Fact Sheet  for Healthcare Providers: BankingDealers.co.za This test is not yet approved or cleared by the [place] and has been authorized for detection and/or diagnosis of SARS-CoV-2 by FDA under an Emergency Use Authorization (EUA).  This EUA will remain in effect (meaning this test can be used) for the duration of the COVID-19 declaration under Section 564(b)(1) of the Act, 21 U.S.C. section 360bbb-3(b)(1), unless the authorization is terminated or revoked sooner. Performed at [facility], [address], [place], [place] [address]   Surgical pcr screen     Status: None   Collection Time: 03/29/19 12:39 AM   Specimen: Nasal Mucosa; Nasal Swab  Result Value Ref Range Status   MRSA, PCR NEGATIVE NEGATIVE Final   Staphylococcus aureus NEGATIVE NEGATIVE Final    Comment: (NOTE) The Xpert SA Assay (FDA approved for NASAL specimens in patients 78 years of age and older), is one component of a comprehensive surveillance program. It is not intended to diagnose infection nor to guide or monitor treatment. Performed at [facility], [place] [address], [place], [place] [address]      Time coordinating discharge: Over 30 minutes  SIGNED:   Little Ishikawa, DO [facility] 04/04/2019, 7:53 AM Pager   If 7PM-7AM, please contact night-coverage [email or web address] Password [email or web address]

## 2019-04-04 NOTE — TOC Transition Note (Signed)
Transition of Care [facility]) - CM/SW Discharge Note   Patient Details  Name: Darlene Santos MRN: [id number] Date of Birth: 10-Jul-1930  Transition of Care [facility]) CM/SW Contact:  Alexander [place], [place] Phone Number: 04/04/2019, 12:48 PM   Clinical Narrative:    Spoke several times today with pt daughter Darlene Santos; she states that she told [facility] yesterday evening that it was too late for equipment as they did not anticipate being able to drop it off before 9pm. Pt daughter requested they drop it today; CSW confirmed with Bethanne Ginger that delivery is scheduled for "mid day". Pt daughter will be home. She is aware and requests pt have [facility] home. When equipment delivered then will be discharged home via [place].    Final next level of care: [place] Barriers to Discharge: Equipment Delay   Patient Goals and CMS Choice Patient states their goals for this hospitalization and ongoing recovery are:: to go home CMS Medicare.gov Compare Post Acute Care list provided to:: Patient Represenative (must comment)(adult daughter Darlene Santos) Choice offered to / list presented to : Adult Children  Discharge Placement Patient to be transferred to facility by: PT[facility] home Name of family member notified: pt daughter Darlene Santos Patient and family notified of of transfer: 04/04/19  Discharge Plan and Services In-house Referral: Clinical Social Work Discharge Planning Services: [employer] Consult Post Acute Care Choice: Durable Medical Equipment, Home Health          DME Arranged: 3-N-1, Hospital bed, [place] Rile, Wheelchair manual DME Agency: [employer] Date DME Agency Contacted: 04/03/19   Representative spoke with at DME Agency: [place]: RN, PT, OT [facility] Agency: [facility] (now [facility]) Date [place]: 04/03/19 Time [place]: 1238 Representative spoke with at [place]: [place] ([place]) Interventions     Readmission Risk  Interventions No flowsheet data found.

## 2019-04-04 NOTE — Plan of Care (Signed)

## 2019-04-05 ENCOUNTER — Encounter: Payer: Self-pay | Admitting: Student

## 2019-04-07 DIAGNOSIS — I10 Essential (primary) hypertension: Secondary | ICD-10-CM | POA: Diagnosis not present

## 2019-04-07 DIAGNOSIS — S20211D Contusion of right front wall of thorax, subsequent encounter: Secondary | ICD-10-CM | POA: Diagnosis not present

## 2019-04-07 DIAGNOSIS — Z7901 Long term (current) use of anticoagulants: Secondary | ICD-10-CM | POA: Diagnosis not present

## 2019-04-07 DIAGNOSIS — S82402D Unspecified fracture of shaft of left fibula, subsequent encounter for closed fracture with routine healing: Secondary | ICD-10-CM | POA: Diagnosis not present

## 2019-04-07 DIAGNOSIS — Z9181 History of falling: Secondary | ICD-10-CM | POA: Diagnosis not present

## 2019-04-07 DIAGNOSIS — S82302D Unspecified fracture of lower end of left tibia, subsequent encounter for closed fracture with routine healing: Secondary | ICD-10-CM | POA: Diagnosis not present

## 2019-04-10 DIAGNOSIS — Z9181 History of falling: Secondary | ICD-10-CM | POA: Diagnosis not present

## 2019-04-10 DIAGNOSIS — Z7901 Long term (current) use of anticoagulants: Secondary | ICD-10-CM | POA: Diagnosis not present

## 2019-04-10 DIAGNOSIS — S82402D Unspecified fracture of shaft of left fibula, subsequent encounter for closed fracture with routine healing: Secondary | ICD-10-CM | POA: Diagnosis not present

## 2019-04-10 DIAGNOSIS — S20211D Contusion of right front wall of thorax, subsequent encounter: Secondary | ICD-10-CM | POA: Diagnosis not present

## 2019-04-10 DIAGNOSIS — S82302D Unspecified fracture of lower end of left tibia, subsequent encounter for closed fracture with routine healing: Secondary | ICD-10-CM | POA: Diagnosis not present

## 2019-04-10 DIAGNOSIS — I10 Essential (primary) hypertension: Secondary | ICD-10-CM | POA: Diagnosis not present

## 2019-04-11 DIAGNOSIS — I10 Essential (primary) hypertension: Secondary | ICD-10-CM | POA: Diagnosis not present

## 2019-04-11 DIAGNOSIS — R413 Other amnesia: Secondary | ICD-10-CM | POA: Diagnosis not present

## 2019-04-11 DIAGNOSIS — S82202A Unspecified fracture of shaft of left tibia, initial encounter for closed fracture: Secondary | ICD-10-CM | POA: Diagnosis not present

## 2019-04-12 DIAGNOSIS — S82402D Unspecified fracture of shaft of left fibula, subsequent encounter for closed fracture with routine healing: Secondary | ICD-10-CM | POA: Diagnosis not present

## 2019-04-12 DIAGNOSIS — S20211D Contusion of right front wall of thorax, subsequent encounter: Secondary | ICD-10-CM | POA: Diagnosis not present

## 2019-04-12 DIAGNOSIS — S82302D Unspecified fracture of lower end of left tibia, subsequent encounter for closed fracture with routine healing: Secondary | ICD-10-CM | POA: Diagnosis not present

## 2019-04-12 DIAGNOSIS — Z7901 Long term (current) use of anticoagulants: Secondary | ICD-10-CM | POA: Diagnosis not present

## 2019-04-12 DIAGNOSIS — I10 Essential (primary) hypertension: Secondary | ICD-10-CM | POA: Diagnosis not present

## 2019-04-12 DIAGNOSIS — Z9181 History of falling: Secondary | ICD-10-CM | POA: Diagnosis not present

## 2019-04-15 DIAGNOSIS — Z9181 History of falling: Secondary | ICD-10-CM | POA: Diagnosis not present

## 2019-04-15 DIAGNOSIS — S82302D Unspecified fracture of lower end of left tibia, subsequent encounter for closed fracture with routine healing: Secondary | ICD-10-CM | POA: Diagnosis not present

## 2019-04-15 DIAGNOSIS — I10 Essential (primary) hypertension: Secondary | ICD-10-CM | POA: Diagnosis not present

## 2019-04-15 DIAGNOSIS — Z7901 Long term (current) use of anticoagulants: Secondary | ICD-10-CM | POA: Diagnosis not present

## 2019-04-15 DIAGNOSIS — S82402D Unspecified fracture of shaft of left fibula, subsequent encounter for closed fracture with routine healing: Secondary | ICD-10-CM | POA: Diagnosis not present

## 2019-04-15 DIAGNOSIS — S20211D Contusion of right front wall of thorax, subsequent encounter: Secondary | ICD-10-CM | POA: Diagnosis not present

## 2019-04-16 DIAGNOSIS — S82202D Unspecified fracture of shaft of left tibia, subsequent encounter for closed fracture with routine healing: Secondary | ICD-10-CM | POA: Diagnosis not present

## 2019-04-16 DIAGNOSIS — S82402D Unspecified fracture of shaft of left fibula, subsequent encounter for closed fracture with routine healing: Secondary | ICD-10-CM | POA: Diagnosis not present

## 2019-04-17 DIAGNOSIS — S82402D Unspecified fracture of shaft of left fibula, subsequent encounter for closed fracture with routine healing: Secondary | ICD-10-CM | POA: Diagnosis not present

## 2019-04-17 DIAGNOSIS — I10 Essential (primary) hypertension: Secondary | ICD-10-CM | POA: Diagnosis not present

## 2019-04-17 DIAGNOSIS — Z9181 History of falling: Secondary | ICD-10-CM | POA: Diagnosis not present

## 2019-04-17 DIAGNOSIS — Z7901 Long term (current) use of anticoagulants: Secondary | ICD-10-CM | POA: Diagnosis not present

## 2019-04-17 DIAGNOSIS — S20211D Contusion of right front wall of thorax, subsequent encounter: Secondary | ICD-10-CM | POA: Diagnosis not present

## 2019-04-17 DIAGNOSIS — S82302D Unspecified fracture of lower end of left tibia, subsequent encounter for closed fracture with routine healing: Secondary | ICD-10-CM | POA: Diagnosis not present

## 2019-04-18 DIAGNOSIS — S82302D Unspecified fracture of lower end of left tibia, subsequent encounter for closed fracture with routine healing: Secondary | ICD-10-CM | POA: Diagnosis not present

## 2019-04-18 DIAGNOSIS — Z7901 Long term (current) use of anticoagulants: Secondary | ICD-10-CM | POA: Diagnosis not present

## 2019-04-18 DIAGNOSIS — Z9181 History of falling: Secondary | ICD-10-CM | POA: Diagnosis not present

## 2019-04-18 DIAGNOSIS — S82402D Unspecified fracture of shaft of left fibula, subsequent encounter for closed fracture with routine healing: Secondary | ICD-10-CM | POA: Diagnosis not present

## 2019-04-18 DIAGNOSIS — S20211D Contusion of right front wall of thorax, subsequent encounter: Secondary | ICD-10-CM | POA: Diagnosis not present

## 2019-04-18 DIAGNOSIS — I10 Essential (primary) hypertension: Secondary | ICD-10-CM | POA: Diagnosis not present

## 2019-04-23 DIAGNOSIS — S82402D Unspecified fracture of shaft of left fibula, subsequent encounter for closed fracture with routine healing: Secondary | ICD-10-CM | POA: Diagnosis not present

## 2019-04-23 DIAGNOSIS — I10 Essential (primary) hypertension: Secondary | ICD-10-CM | POA: Diagnosis not present

## 2019-04-23 DIAGNOSIS — Z7901 Long term (current) use of anticoagulants: Secondary | ICD-10-CM | POA: Diagnosis not present

## 2019-04-23 DIAGNOSIS — S20211D Contusion of right front wall of thorax, subsequent encounter: Secondary | ICD-10-CM | POA: Diagnosis not present

## 2019-04-23 DIAGNOSIS — S82302D Unspecified fracture of lower end of left tibia, subsequent encounter for closed fracture with routine healing: Secondary | ICD-10-CM | POA: Diagnosis not present

## 2019-04-23 DIAGNOSIS — Z9181 History of falling: Secondary | ICD-10-CM | POA: Diagnosis not present

## 2019-05-02 DIAGNOSIS — I10 Essential (primary) hypertension: Secondary | ICD-10-CM | POA: Diagnosis not present

## 2019-05-02 DIAGNOSIS — S82302D Unspecified fracture of lower end of left tibia, subsequent encounter for closed fracture with routine healing: Secondary | ICD-10-CM | POA: Diagnosis not present

## 2019-05-02 DIAGNOSIS — Z9181 History of falling: Secondary | ICD-10-CM | POA: Diagnosis not present

## 2019-05-02 DIAGNOSIS — Z7901 Long term (current) use of anticoagulants: Secondary | ICD-10-CM | POA: Diagnosis not present

## 2019-05-02 DIAGNOSIS — S20211D Contusion of right front wall of thorax, subsequent encounter: Secondary | ICD-10-CM | POA: Diagnosis not present

## 2019-05-02 DIAGNOSIS — S82402D Unspecified fracture of shaft of left fibula, subsequent encounter for closed fracture with routine healing: Secondary | ICD-10-CM | POA: Diagnosis not present

## 2019-05-04 DIAGNOSIS — S82202A Unspecified fracture of shaft of left tibia, initial encounter for closed fracture: Secondary | ICD-10-CM | POA: Diagnosis not present

## 2019-05-07 DIAGNOSIS — S20211D Contusion of right front wall of thorax, subsequent encounter: Secondary | ICD-10-CM | POA: Diagnosis not present

## 2019-05-07 DIAGNOSIS — S82402D Unspecified fracture of shaft of left fibula, subsequent encounter for closed fracture with routine healing: Secondary | ICD-10-CM | POA: Diagnosis not present

## 2019-05-07 DIAGNOSIS — Z9181 History of falling: Secondary | ICD-10-CM | POA: Diagnosis not present

## 2019-05-07 DIAGNOSIS — I10 Essential (primary) hypertension: Secondary | ICD-10-CM | POA: Diagnosis not present

## 2019-05-07 DIAGNOSIS — Z7901 Long term (current) use of anticoagulants: Secondary | ICD-10-CM | POA: Diagnosis not present

## 2019-05-07 DIAGNOSIS — S82302D Unspecified fracture of lower end of left tibia, subsequent encounter for closed fracture with routine healing: Secondary | ICD-10-CM | POA: Diagnosis not present

## 2019-05-14 DIAGNOSIS — S82202D Unspecified fracture of shaft of left tibia, subsequent encounter for closed fracture with routine healing: Secondary | ICD-10-CM | POA: Diagnosis not present

## 2019-05-16 DIAGNOSIS — Z9181 History of falling: Secondary | ICD-10-CM | POA: Diagnosis not present

## 2019-05-16 DIAGNOSIS — I10 Essential (primary) hypertension: Secondary | ICD-10-CM | POA: Diagnosis not present

## 2019-05-16 DIAGNOSIS — Z7901 Long term (current) use of anticoagulants: Secondary | ICD-10-CM | POA: Diagnosis not present

## 2019-05-16 DIAGNOSIS — S20211D Contusion of right front wall of thorax, subsequent encounter: Secondary | ICD-10-CM | POA: Diagnosis not present

## 2019-05-16 DIAGNOSIS — S82302D Unspecified fracture of lower end of left tibia, subsequent encounter for closed fracture with routine healing: Secondary | ICD-10-CM | POA: Diagnosis not present

## 2019-05-16 DIAGNOSIS — S82402D Unspecified fracture of shaft of left fibula, subsequent encounter for closed fracture with routine healing: Secondary | ICD-10-CM | POA: Diagnosis not present

## 2019-05-22 DIAGNOSIS — S82402D Unspecified fracture of shaft of left fibula, subsequent encounter for closed fracture with routine healing: Secondary | ICD-10-CM | POA: Diagnosis not present

## 2019-05-22 DIAGNOSIS — S20211D Contusion of right front wall of thorax, subsequent encounter: Secondary | ICD-10-CM | POA: Diagnosis not present

## 2019-05-22 DIAGNOSIS — Z7901 Long term (current) use of anticoagulants: Secondary | ICD-10-CM | POA: Diagnosis not present

## 2019-05-22 DIAGNOSIS — S82302D Unspecified fracture of lower end of left tibia, subsequent encounter for closed fracture with routine healing: Secondary | ICD-10-CM | POA: Diagnosis not present

## 2019-05-22 DIAGNOSIS — Z9181 History of falling: Secondary | ICD-10-CM | POA: Diagnosis not present

## 2019-05-22 DIAGNOSIS — I10 Essential (primary) hypertension: Secondary | ICD-10-CM | POA: Diagnosis not present

## 2019-05-23 DIAGNOSIS — S82302D Unspecified fracture of lower end of left tibia, subsequent encounter for closed fracture with routine healing: Secondary | ICD-10-CM | POA: Diagnosis not present

## 2019-05-23 DIAGNOSIS — Z7901 Long term (current) use of anticoagulants: Secondary | ICD-10-CM | POA: Diagnosis not present

## 2019-05-23 DIAGNOSIS — S20211D Contusion of right front wall of thorax, subsequent encounter: Secondary | ICD-10-CM | POA: Diagnosis not present

## 2019-05-23 DIAGNOSIS — I10 Essential (primary) hypertension: Secondary | ICD-10-CM | POA: Diagnosis not present

## 2019-05-23 DIAGNOSIS — S82402D Unspecified fracture of shaft of left fibula, subsequent encounter for closed fracture with routine healing: Secondary | ICD-10-CM | POA: Diagnosis not present

## 2019-05-23 DIAGNOSIS — Z9181 History of falling: Secondary | ICD-10-CM | POA: Diagnosis not present

## 2019-05-30 DIAGNOSIS — S82302D Unspecified fracture of lower end of left tibia, subsequent encounter for closed fracture with routine healing: Secondary | ICD-10-CM | POA: Diagnosis not present

## 2019-05-30 DIAGNOSIS — S20211D Contusion of right front wall of thorax, subsequent encounter: Secondary | ICD-10-CM | POA: Diagnosis not present

## 2019-05-30 DIAGNOSIS — Z9181 History of falling: Secondary | ICD-10-CM | POA: Diagnosis not present

## 2019-05-30 DIAGNOSIS — Z7901 Long term (current) use of anticoagulants: Secondary | ICD-10-CM | POA: Diagnosis not present

## 2019-05-30 DIAGNOSIS — S82402D Unspecified fracture of shaft of left fibula, subsequent encounter for closed fracture with routine healing: Secondary | ICD-10-CM | POA: Diagnosis not present

## 2019-05-30 DIAGNOSIS — I10 Essential (primary) hypertension: Secondary | ICD-10-CM | POA: Diagnosis not present

## 2019-06-03 DIAGNOSIS — Z9181 History of falling: Secondary | ICD-10-CM | POA: Diagnosis not present

## 2019-06-03 DIAGNOSIS — I10 Essential (primary) hypertension: Secondary | ICD-10-CM | POA: Diagnosis not present

## 2019-06-03 DIAGNOSIS — S82302D Unspecified fracture of lower end of left tibia, subsequent encounter for closed fracture with routine healing: Secondary | ICD-10-CM | POA: Diagnosis not present

## 2019-06-03 DIAGNOSIS — Z7901 Long term (current) use of anticoagulants: Secondary | ICD-10-CM | POA: Diagnosis not present

## 2019-06-03 DIAGNOSIS — S20211D Contusion of right front wall of thorax, subsequent encounter: Secondary | ICD-10-CM | POA: Diagnosis not present

## 2019-06-03 DIAGNOSIS — S82402D Unspecified fracture of shaft of left fibula, subsequent encounter for closed fracture with routine healing: Secondary | ICD-10-CM | POA: Diagnosis not present

## 2019-06-04 DIAGNOSIS — S82202A Unspecified fracture of shaft of left tibia, initial encounter for closed fracture: Secondary | ICD-10-CM | POA: Diagnosis not present

## 2019-06-11 DIAGNOSIS — S82402D Unspecified fracture of shaft of left fibula, subsequent encounter for closed fracture with routine healing: Secondary | ICD-10-CM | POA: Diagnosis not present

## 2019-06-11 DIAGNOSIS — S82202D Unspecified fracture of shaft of left tibia, subsequent encounter for closed fracture with routine healing: Secondary | ICD-10-CM | POA: Diagnosis not present

## 2019-06-13 DIAGNOSIS — R413 Other amnesia: Secondary | ICD-10-CM | POA: Diagnosis not present

## 2019-06-13 DIAGNOSIS — Z8781 Personal history of (healed) traumatic fracture: Secondary | ICD-10-CM | POA: Diagnosis not present

## 2019-06-13 DIAGNOSIS — I10 Essential (primary) hypertension: Secondary | ICD-10-CM | POA: Diagnosis not present

## 2019-06-14 DIAGNOSIS — S82302D Unspecified fracture of lower end of left tibia, subsequent encounter for closed fracture with routine healing: Secondary | ICD-10-CM | POA: Diagnosis not present

## 2019-06-14 DIAGNOSIS — I10 Essential (primary) hypertension: Secondary | ICD-10-CM | POA: Diagnosis not present

## 2019-06-14 DIAGNOSIS — Z7901 Long term (current) use of anticoagulants: Secondary | ICD-10-CM | POA: Diagnosis not present

## 2019-06-14 DIAGNOSIS — Z9181 History of falling: Secondary | ICD-10-CM | POA: Diagnosis not present

## 2019-06-14 DIAGNOSIS — S82402D Unspecified fracture of shaft of left fibula, subsequent encounter for closed fracture with routine healing: Secondary | ICD-10-CM | POA: Diagnosis not present

## 2019-06-18 DIAGNOSIS — I10 Essential (primary) hypertension: Secondary | ICD-10-CM | POA: Diagnosis not present

## 2019-06-18 DIAGNOSIS — S82302D Unspecified fracture of lower end of left tibia, subsequent encounter for closed fracture with routine healing: Secondary | ICD-10-CM | POA: Diagnosis not present

## 2019-06-18 DIAGNOSIS — Z7901 Long term (current) use of anticoagulants: Secondary | ICD-10-CM | POA: Diagnosis not present

## 2019-06-18 DIAGNOSIS — Z9181 History of falling: Secondary | ICD-10-CM | POA: Diagnosis not present

## 2019-06-18 DIAGNOSIS — S82402D Unspecified fracture of shaft of left fibula, subsequent encounter for closed fracture with routine healing: Secondary | ICD-10-CM | POA: Diagnosis not present

## 2019-06-25 DIAGNOSIS — I10 Essential (primary) hypertension: Secondary | ICD-10-CM | POA: Diagnosis not present

## 2019-06-25 DIAGNOSIS — S82302D Unspecified fracture of lower end of left tibia, subsequent encounter for closed fracture with routine healing: Secondary | ICD-10-CM | POA: Diagnosis not present

## 2019-06-25 DIAGNOSIS — Z7901 Long term (current) use of anticoagulants: Secondary | ICD-10-CM | POA: Diagnosis not present

## 2019-06-25 DIAGNOSIS — S82402D Unspecified fracture of shaft of left fibula, subsequent encounter for closed fracture with routine healing: Secondary | ICD-10-CM | POA: Diagnosis not present

## 2019-06-25 DIAGNOSIS — Z9181 History of falling: Secondary | ICD-10-CM | POA: Diagnosis not present

## 2019-07-03 DIAGNOSIS — S82302D Unspecified fracture of lower end of left tibia, subsequent encounter for closed fracture with routine healing: Secondary | ICD-10-CM | POA: Diagnosis not present

## 2019-07-03 DIAGNOSIS — I10 Essential (primary) hypertension: Secondary | ICD-10-CM | POA: Diagnosis not present

## 2019-07-03 DIAGNOSIS — S82402D Unspecified fracture of shaft of left fibula, subsequent encounter for closed fracture with routine healing: Secondary | ICD-10-CM | POA: Diagnosis not present

## 2019-07-03 DIAGNOSIS — Z9181 History of falling: Secondary | ICD-10-CM | POA: Diagnosis not present

## 2019-07-03 DIAGNOSIS — Z7901 Long term (current) use of anticoagulants: Secondary | ICD-10-CM | POA: Diagnosis not present

## 2019-07-04 DIAGNOSIS — S82202A Unspecified fracture of shaft of left tibia, initial encounter for closed fracture: Secondary | ICD-10-CM | POA: Diagnosis not present

## 2019-07-11 DIAGNOSIS — Z9181 History of falling: Secondary | ICD-10-CM | POA: Diagnosis not present

## 2019-07-11 DIAGNOSIS — S82402D Unspecified fracture of shaft of left fibula, subsequent encounter for closed fracture with routine healing: Secondary | ICD-10-CM | POA: Diagnosis not present

## 2019-07-11 DIAGNOSIS — I10 Essential (primary) hypertension: Secondary | ICD-10-CM | POA: Diagnosis not present

## 2019-07-11 DIAGNOSIS — Z7901 Long term (current) use of anticoagulants: Secondary | ICD-10-CM | POA: Diagnosis not present

## 2019-07-11 DIAGNOSIS — S82302D Unspecified fracture of lower end of left tibia, subsequent encounter for closed fracture with routine healing: Secondary | ICD-10-CM | POA: Diagnosis not present

## 2019-07-29 DIAGNOSIS — Z961 Presence of intraocular lens: Secondary | ICD-10-CM | POA: Diagnosis not present

## 2019-08-04 DIAGNOSIS — S82202A Unspecified fracture of shaft of left tibia, initial encounter for closed fracture: Secondary | ICD-10-CM | POA: Diagnosis not present

## 2019-09-04 DIAGNOSIS — S82202A Unspecified fracture of shaft of left tibia, initial encounter for closed fracture: Secondary | ICD-10-CM | POA: Diagnosis not present

## 2019-09-10 DIAGNOSIS — S82402D Unspecified fracture of shaft of left fibula, subsequent encounter for closed fracture with routine healing: Secondary | ICD-10-CM | POA: Diagnosis not present

## 2019-09-10 DIAGNOSIS — S82202D Unspecified fracture of shaft of left tibia, subsequent encounter for closed fracture with routine healing: Secondary | ICD-10-CM | POA: Diagnosis not present

## 2019-12-17 DIAGNOSIS — Z0000 Encounter for general adult medical examination without abnormal findings: Secondary | ICD-10-CM | POA: Diagnosis not present

## 2019-12-17 DIAGNOSIS — I10 Essential (primary) hypertension: Secondary | ICD-10-CM | POA: Diagnosis not present

## 2019-12-17 DIAGNOSIS — Z1389 Encounter for screening for other disorder: Secondary | ICD-10-CM | POA: Diagnosis not present

## 2019-12-17 DIAGNOSIS — E538 Deficiency of other specified B group vitamins: Secondary | ICD-10-CM | POA: Diagnosis not present

## 2020-04-03 DIAGNOSIS — H52203 Unspecified astigmatism, bilateral: Secondary | ICD-10-CM | POA: Diagnosis not present

## 2020-04-03 DIAGNOSIS — Z961 Presence of intraocular lens: Secondary | ICD-10-CM | POA: Diagnosis not present

## 2020-04-27 DIAGNOSIS — Z23 Encounter for immunization: Secondary | ICD-10-CM | POA: Diagnosis not present

## 2020-06-05 DIAGNOSIS — B078 Other viral warts: Secondary | ICD-10-CM | POA: Diagnosis not present

## 2020-06-05 DIAGNOSIS — L814 Other melanin hyperpigmentation: Secondary | ICD-10-CM | POA: Diagnosis not present

## 2020-06-05 DIAGNOSIS — L821 Other seborrheic keratosis: Secondary | ICD-10-CM | POA: Diagnosis not present

## 2020-06-05 DIAGNOSIS — L820 Inflamed seborrheic keratosis: Secondary | ICD-10-CM | POA: Diagnosis not present

## 2020-12-24 ENCOUNTER — Other Ambulatory Visit: Payer: Self-pay | Admitting: Internal Medicine

## 2020-12-24 DIAGNOSIS — Z23 Encounter for immunization: Secondary | ICD-10-CM | POA: Diagnosis not present

## 2020-12-24 DIAGNOSIS — Z0000 Encounter for general adult medical examination without abnormal findings: Secondary | ICD-10-CM | POA: Diagnosis not present

## 2020-12-24 DIAGNOSIS — M810 Age-related osteoporosis without current pathological fracture: Secondary | ICD-10-CM

## 2020-12-24 DIAGNOSIS — I10 Essential (primary) hypertension: Secondary | ICD-10-CM | POA: Diagnosis not present

## 2020-12-24 DIAGNOSIS — Z1389 Encounter for screening for other disorder: Secondary | ICD-10-CM | POA: Diagnosis not present

## 2020-12-24 DIAGNOSIS — E538 Deficiency of other specified B group vitamins: Secondary | ICD-10-CM | POA: Diagnosis not present

## 2020-12-31 ENCOUNTER — Other Ambulatory Visit: Payer: Medicare HMO
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
## 2021-02-19 IMAGING — RF LEFT ANKLE - 2 VIEW
1 series · 4 of 4 positions shown · non-contrast
Comparison: CT 03/28/2019.

CLINICAL DATA: Left ankle surgery.

EXAM:
LEFT ANKLE - 2 VIEW

[Series 1: run · 4 of 4 slices shown]
[im 1/4]
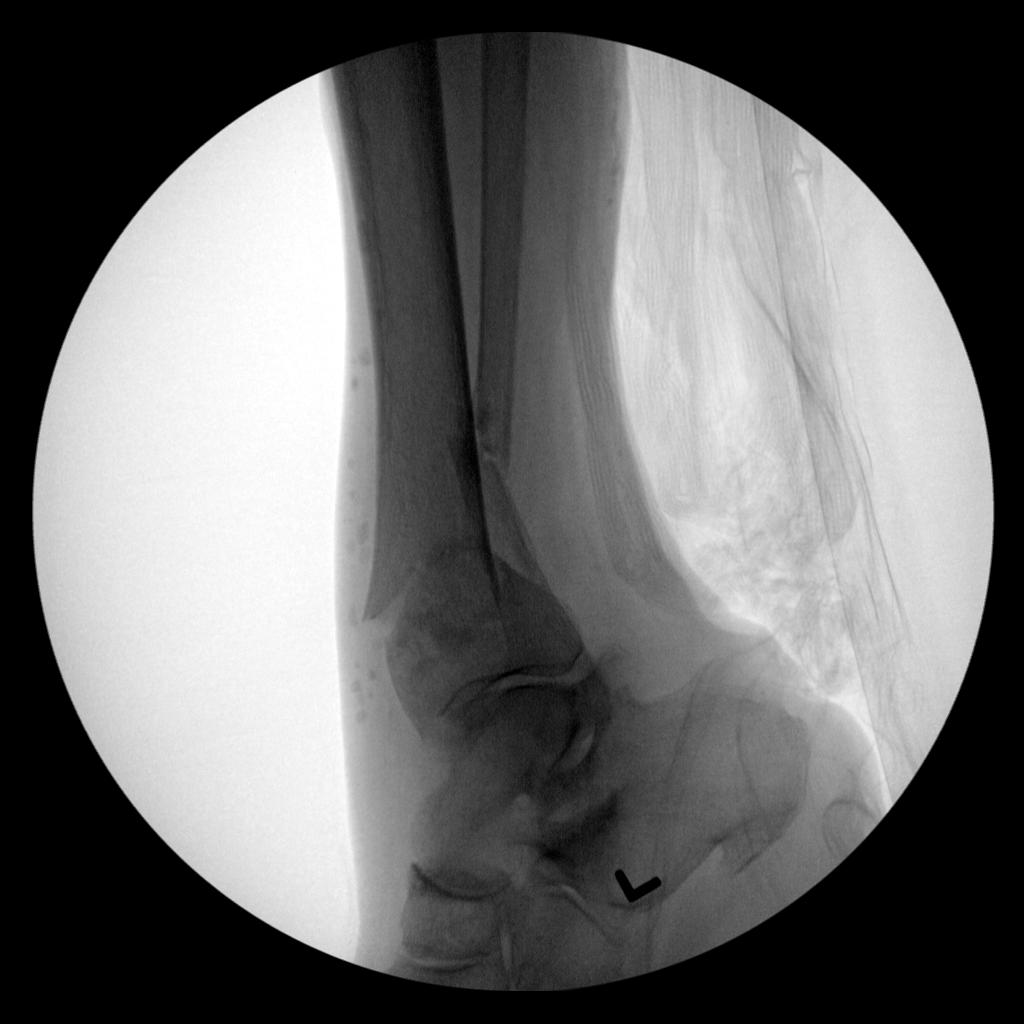
[im 2/4]
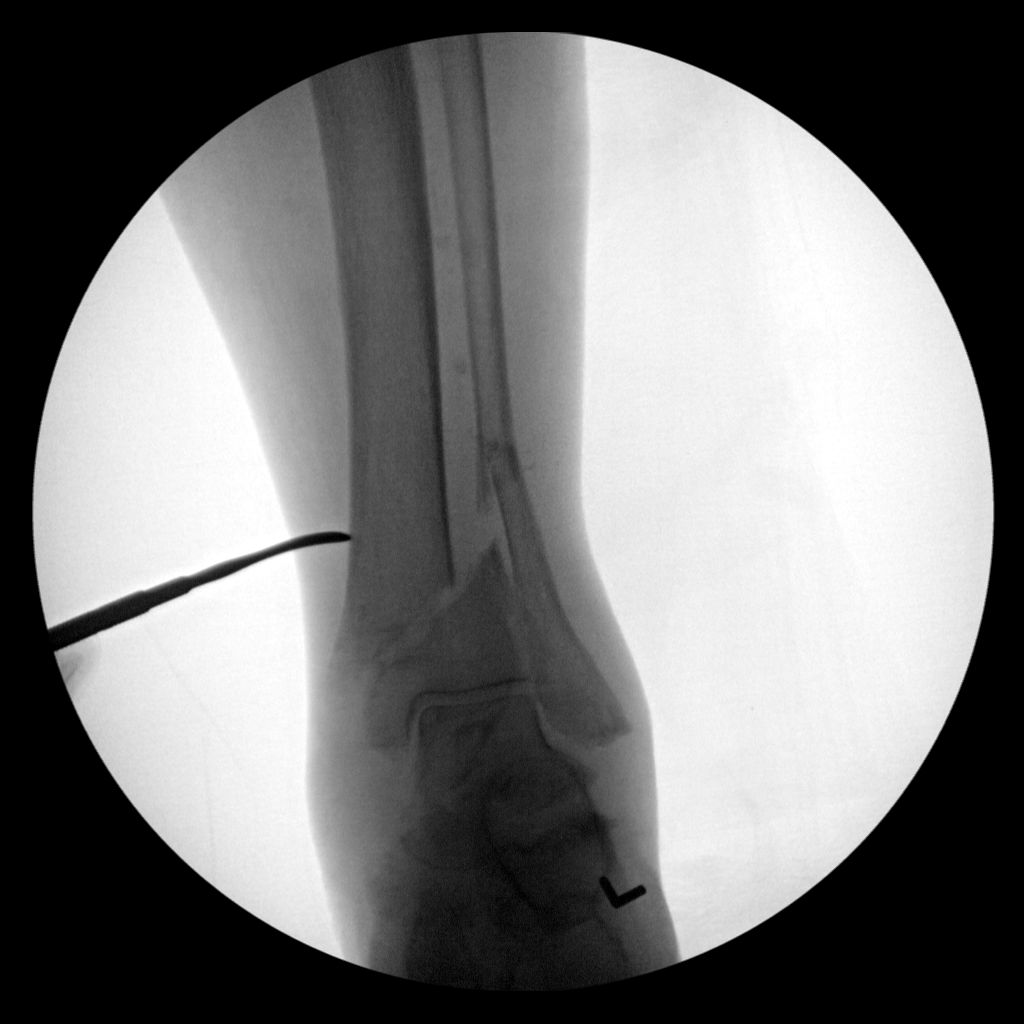
[im 3/4]
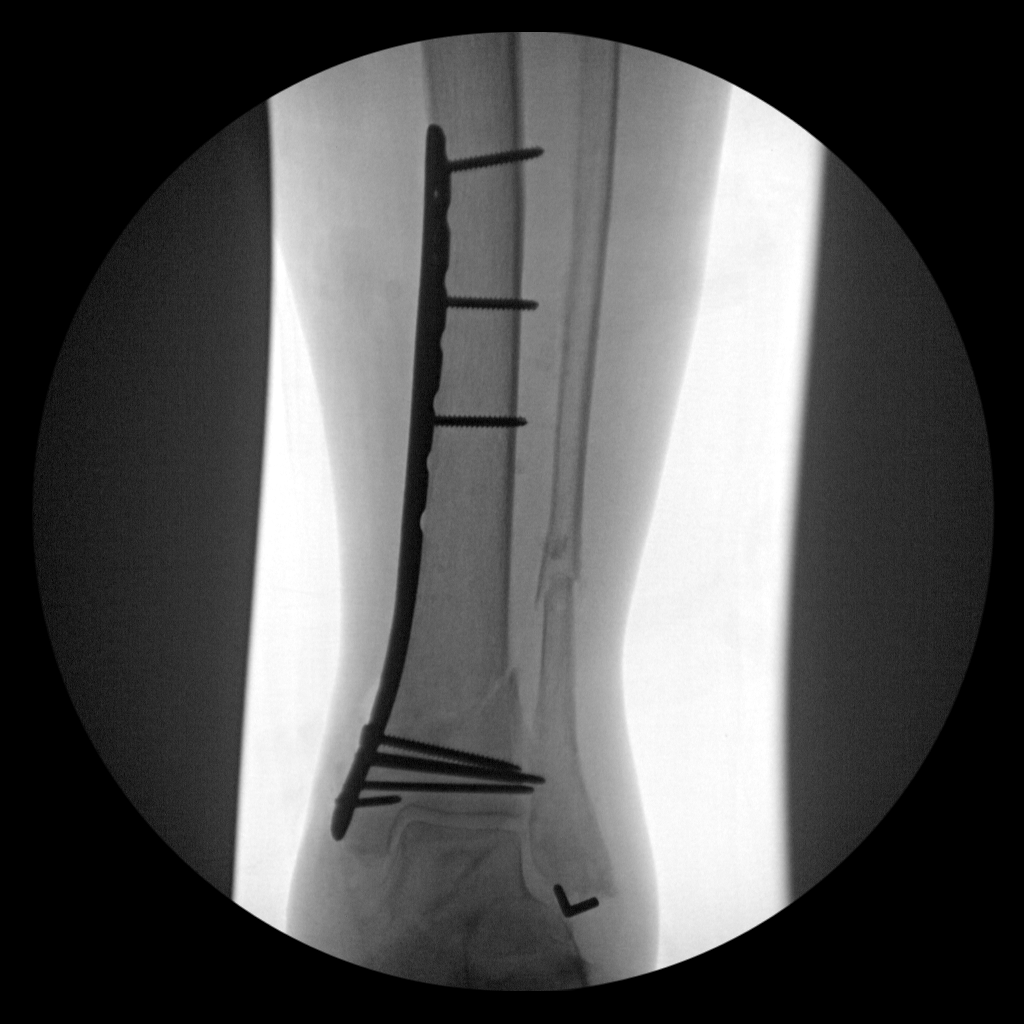
[im 4/4]
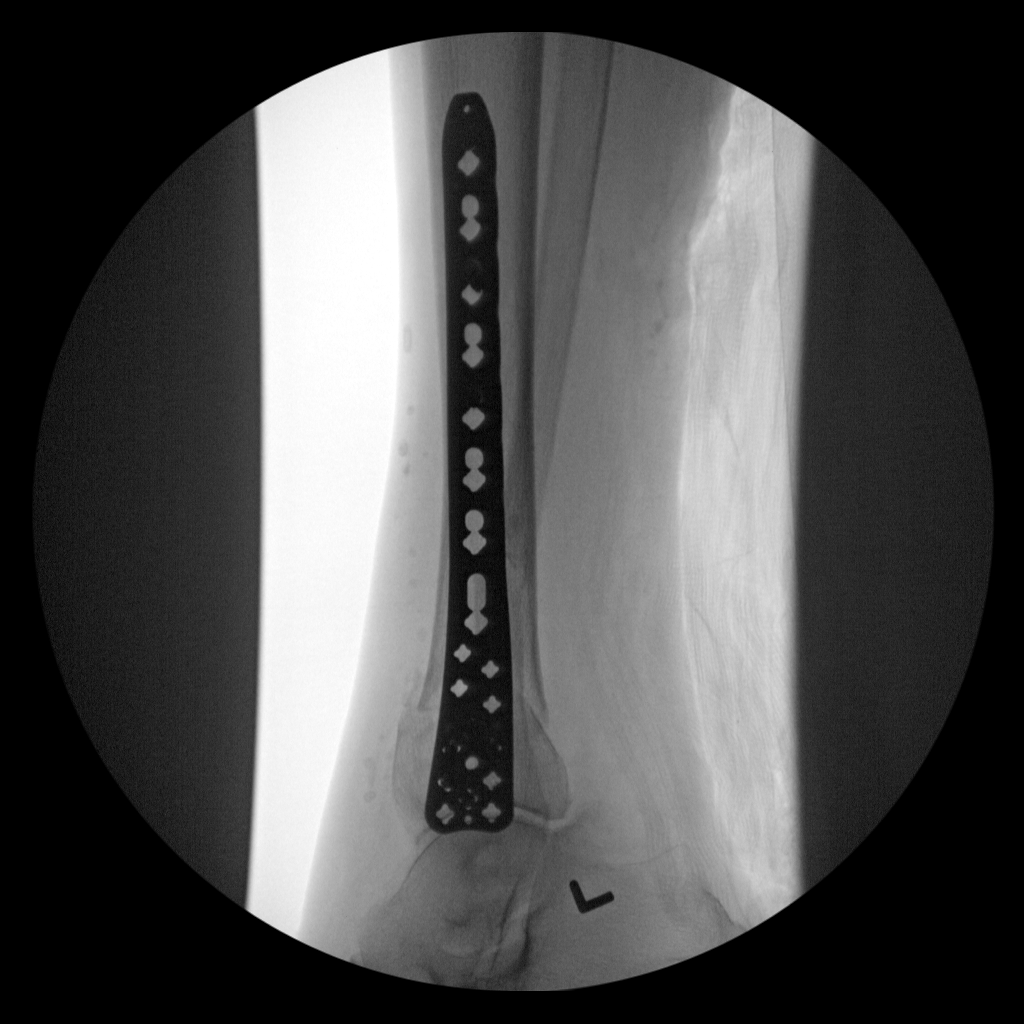

[4 of 4 positions shown; findings below may reference images not displayed]

FINDINGS: Plate and screw fixation of the distal tibia noted. Anatomic
alignment. Distal fibular fracture again noted.
IMPRESSION: Plate and screw fixation of the distal tibia noted. Hardware intact.
Anatomic alignment. Distal fibular fracture again noted.

## 2021-02-24 ENCOUNTER — Ambulatory Visit
Admission: RE | Admit: 2021-02-24 | Discharge: 2021-02-24 | Disposition: A | Discharge status: Home / Self Care | Payer: Medicare HMO | Source: Ambulatory Visit | Attending: Internal Medicine | Admitting: Internal Medicine

## 2021-02-24 ENCOUNTER — Other Ambulatory Visit: Payer: Self-pay

## 2021-02-24 DIAGNOSIS — M810 Age-related osteoporosis without current pathological fracture: Secondary | ICD-10-CM

## 2021-02-24 DIAGNOSIS — Z780 Asymptomatic menopausal state: Secondary | ICD-10-CM | POA: Diagnosis not present

## 2021-03-03 DIAGNOSIS — Z961 Presence of intraocular lens: Secondary | ICD-10-CM | POA: Diagnosis not present

## 2021-03-03 DIAGNOSIS — H52202 Unspecified astigmatism, left eye: Secondary | ICD-10-CM | POA: Diagnosis not present

## 2021-04-22 DIAGNOSIS — Z23 Encounter for immunization: Secondary | ICD-10-CM | POA: Diagnosis not present

## 2021-04-29 DIAGNOSIS — M810 Age-related osteoporosis without current pathological fracture: Secondary | ICD-10-CM | POA: Diagnosis not present

## 2021-04-29 DIAGNOSIS — M8580 Other specified disorders of bone density and structure, unspecified site: Secondary | ICD-10-CM | POA: Diagnosis not present

## 2021-07-08 DIAGNOSIS — D1801 Hemangioma of skin and subcutaneous tissue: Secondary | ICD-10-CM | POA: Diagnosis not present

## 2021-07-08 DIAGNOSIS — B078 Other viral warts: Secondary | ICD-10-CM | POA: Diagnosis not present

## 2021-07-08 DIAGNOSIS — L821 Other seborrheic keratosis: Secondary | ICD-10-CM | POA: Diagnosis not present

## 2021-07-08 DIAGNOSIS — L820 Inflamed seborrheic keratosis: Secondary | ICD-10-CM | POA: Diagnosis not present

## 2021-07-08 DIAGNOSIS — D225 Melanocytic nevi of trunk: Secondary | ICD-10-CM | POA: Diagnosis not present

## 2021-09-28 ENCOUNTER — Other Ambulatory Visit: Payer: Self-pay

## 2021-09-28 ENCOUNTER — Emergency Department
Admission: EM | Admit: 2021-09-28 | Discharge: 2021-09-28 | Disposition: A | Discharge status: Home / Self Care | Payer: Medicare HMO | Attending: Emergency Medicine | Admitting: Emergency Medicine

## 2021-09-28 ENCOUNTER — Emergency Department: Payer: Medicare HMO

## 2021-09-28 DIAGNOSIS — Y9301 Activity, walking, marching and hiking: Secondary | ICD-10-CM | POA: Diagnosis not present

## 2021-09-28 DIAGNOSIS — M7122 Synovial cyst of popliteal space [Baker], left knee: Secondary | ICD-10-CM | POA: Diagnosis not present

## 2021-09-28 DIAGNOSIS — M79605 Pain in left leg: Secondary | ICD-10-CM

## 2021-09-28 DIAGNOSIS — M25552 Pain in left hip: Secondary | ICD-10-CM | POA: Diagnosis not present

## 2021-09-28 NOTE — ED Triage Notes (Signed)
Pt comes with c/o left leg pain. Pt states this started this am. Pt states she then noticed varicose vein in upper part of leg. Pt states little pain in calf area.

## 2021-09-28 NOTE — ED Provider Notes (Signed)
[facility] Provider Note    Event Date/Time   First MD Initiated Contact with Patient 09/28/21 1258     (approximate)   History   Chief Complaint Leg Pain   HPI Darlene Santos is a 88 y.o. female, no remarkable medical history, presents to the emergency department for evaluation of left leg pain.  Patient states that the pain started this morning.  She says it hurts most when she is walking, primarily in the calf region.  Denies fever/chills, chest pain, shortness of breath, numbness/tingling in lower extremity, abdominal pain, back pain, neck pain, urinary symptoms, nausea/vomiting, or headache.   History Limitations: No limitations      Physical Exam  Triage Vital Signs: ED Triage Vitals  Enc Vitals Group     BP 09/28/21 1223 (!) 144/101     Pulse Rate 09/28/21 1223 (!) 107     Resp 09/28/21 1223 18     Temp 09/28/21 1223 98.2 F (36.8 C)     Temp src --      SpO2 09/28/21 1223 95 %     Weight --      Height --      Head Circumference --      Peak Flow --      Pain Score 09/28/21 1221 0     Pain Loc --      Pain Edu? --      Excl. in [place]? --     Most recent vital signs: Vitals:   09/28/21 1223  BP: (!) 144/101  Pulse: (!) 107  Resp: 18  Temp: 98.2 F (36.8 C)  SpO2: 95%    General: Awake, NAD.  CV: Good peripheral perfusion.  Resp: Normal effort.  Abd: Soft, non-tender. No distention.  Neuro: At baseline. No gross neurological deficits. Other: No gross deformities in the left lower extremity.  Normal range of motion.  No tenderness when palpating the calf or knee.  No surrounding erythema.  Negative Homan sign.  Patient is able to ambulate without difficulty.  Physical Exam    ED Results / Procedures / Treatments  Labs (all labs ordered are listed, but only abnormal results are displayed) Labs Reviewed - No data to display   EKG Not applicable.   RADIOLOGY  ED Provider Interpretation: I personally reviewed and  interpreted this ultrasound, no evidence of DVT.  Findings suggestive of Baker's cyst.  No results found.  PROCEDURES:  Critical Care performed: None.  Procedures    MEDICATIONS ORDERED IN ED: Medications - No data to display   IMPRESSION / MDM / [place] / ED COURSE  I reviewed the triage vital signs and the nursing notes.                              Differential diagnosis includes, but is not limited to, DVT, gastrocnemius strain, Baker's cyst  ED Course Patient appears well.  Vital signs within normal limits for the patient.  NAD.  DVT shows Baker's cyst in the left lower extremity.  No evidence of DVT  Upon reexamination, patient states that she feels fine, though does mention pain in her left hip as well.  Denies any recent falls or injuries.  Patient still able to ambulate well throughout the day.  She states that this pain has been going on for well over a year.  I asked her if she wanted to get an x-ray done, but  she stated that she is ready to be discharged now.  She has not tried any Tylenol/ibuprofen at home.  Encouraged her to try these modalities and follow-up with orthopedics as needed  Assessment/Plan Given the patient's history, physical exam, and work-up thus far, I do not suspect any serious or life-threatening pathology.  Ultrasound shows evidence of Baker's cyst, which may be the cause of her symptoms.  No evidence of DVT patient does not have any difficulty ambulating.  No evidence of neurovascular compromise.  I believe patient is safe for discharge.  Advised her to follow-up with her primary care provider as needed.   Patient was provided with anticipatory guidance, return precautions, and educational material. Encouraged the patient to return to the emergency department at any time if they begin to experience any new or worsening symptoms.       FINAL CLINICAL IMPRESSION(S) / ED DIAGNOSES   Final diagnoses:  Left leg pain  Synovial cyst of  left popliteal space     Rx / DC Orders   ED Discharge Orders     None        Note:  This document was prepared using Dragon voice recognition software and may include unintentional dictation errors.   Teodoro Spray, [place] 09/28/21 1655    Nena Polio, MD 09/29/21 [phone]

## 2021-09-28 NOTE — Discharge Instructions (Addendum)
-  Take Tylenol/ibuprofen as needed for pain. -Follow-up with the orthopedist listed above if continue to have pain in your hip, as discussed. -Return to the emergency department anytime if you begin to experience any new or worsening symptoms. -Schedule appointment with your regular doctor as discussed

## 2021-10-22 DIAGNOSIS — M810 Age-related osteoporosis without current pathological fracture: Secondary | ICD-10-CM | POA: Diagnosis not present

## 2022-01-27 DIAGNOSIS — Z8679 Personal history of other diseases of the circulatory system: Secondary | ICD-10-CM | POA: Diagnosis not present

## 2022-01-27 DIAGNOSIS — D510 Vitamin B12 deficiency anemia due to intrinsic factor deficiency: Secondary | ICD-10-CM | POA: Diagnosis not present

## 2022-01-27 DIAGNOSIS — Z0000 Encounter for general adult medical examination without abnormal findings: Secondary | ICD-10-CM | POA: Diagnosis not present

## 2022-01-27 DIAGNOSIS — I350 Nonrheumatic aortic (valve) stenosis: Secondary | ICD-10-CM | POA: Diagnosis not present

## 2022-01-27 DIAGNOSIS — M810 Age-related osteoporosis without current pathological fracture: Secondary | ICD-10-CM | POA: Diagnosis not present

## 2022-01-27 DIAGNOSIS — Z1331 Encounter for screening for depression: Secondary | ICD-10-CM | POA: Diagnosis not present

## 2022-03-04 DIAGNOSIS — I350 Nonrheumatic aortic (valve) stenosis: Secondary | ICD-10-CM | POA: Diagnosis not present

## 2022-03-04 DIAGNOSIS — I10 Essential (primary) hypertension: Secondary | ICD-10-CM | POA: Diagnosis not present

## 2022-03-04 DIAGNOSIS — R4181 Age-related cognitive decline: Secondary | ICD-10-CM | POA: Diagnosis not present

## 2022-03-04 DIAGNOSIS — D510 Vitamin B12 deficiency anemia due to intrinsic factor deficiency: Secondary | ICD-10-CM | POA: Diagnosis not present

## 2022-03-04 DIAGNOSIS — R262 Difficulty in walking, not elsewhere classified: Secondary | ICD-10-CM | POA: Diagnosis not present

## 2022-03-09 DIAGNOSIS — H52202 Unspecified astigmatism, left eye: Secondary | ICD-10-CM | POA: Diagnosis not present

## 2022-03-09 DIAGNOSIS — Z961 Presence of intraocular lens: Secondary | ICD-10-CM | POA: Diagnosis not present

## 2022-05-18 DIAGNOSIS — I10 Essential (primary) hypertension: Secondary | ICD-10-CM | POA: Diagnosis not present

## 2022-05-18 DIAGNOSIS — D510 Vitamin B12 deficiency anemia due to intrinsic factor deficiency: Secondary | ICD-10-CM | POA: Diagnosis not present

## 2022-05-18 DIAGNOSIS — R4189 Other symptoms and signs involving cognitive functions and awareness: Secondary | ICD-10-CM | POA: Diagnosis not present

## 2022-07-05 DIAGNOSIS — Z20822 Contact with and (suspected) exposure to covid-19: Secondary | ICD-10-CM | POA: Diagnosis not present

## 2022-07-27 DIAGNOSIS — R4189 Other symptoms and signs involving cognitive functions and awareness: Secondary | ICD-10-CM | POA: Diagnosis not present

## 2022-07-27 DIAGNOSIS — E538 Deficiency of other specified B group vitamins: Secondary | ICD-10-CM | POA: Diagnosis not present

## 2022-07-27 DIAGNOSIS — I10 Essential (primary) hypertension: Secondary | ICD-10-CM | POA: Diagnosis not present

## 2022-07-27 DIAGNOSIS — D510 Vitamin B12 deficiency anemia due to intrinsic factor deficiency: Secondary | ICD-10-CM | POA: Diagnosis not present

## 2022-07-27 DIAGNOSIS — M810 Age-related osteoporosis without current pathological fracture: Secondary | ICD-10-CM | POA: Diagnosis not present

## 2022-09-27 DIAGNOSIS — I10 Essential (primary) hypertension: Secondary | ICD-10-CM | POA: Diagnosis not present

## 2022-09-27 DIAGNOSIS — E538 Deficiency of other specified B group vitamins: Secondary | ICD-10-CM | POA: Diagnosis not present

## 2023-01-18 DIAGNOSIS — H52203 Unspecified astigmatism, bilateral: Secondary | ICD-10-CM | POA: Diagnosis not present

## 2023-01-18 DIAGNOSIS — Z961 Presence of intraocular lens: Secondary | ICD-10-CM | POA: Diagnosis not present

## 2023-06-21 DIAGNOSIS — D510 Vitamin B12 deficiency anemia due to intrinsic factor deficiency: Secondary | ICD-10-CM | POA: Diagnosis not present

## 2023-06-21 DIAGNOSIS — I350 Nonrheumatic aortic (valve) stenosis: Secondary | ICD-10-CM | POA: Diagnosis not present

## 2023-06-21 DIAGNOSIS — M810 Age-related osteoporosis without current pathological fracture: Secondary | ICD-10-CM | POA: Diagnosis not present

## 2023-06-21 DIAGNOSIS — Z23 Encounter for immunization: Secondary | ICD-10-CM | POA: Diagnosis not present

## 2023-06-21 DIAGNOSIS — Z9989 Dependence on other enabling machines and devices: Secondary | ICD-10-CM | POA: Diagnosis not present

## 2023-06-21 DIAGNOSIS — R4189 Other symptoms and signs involving cognitive functions and awareness: Secondary | ICD-10-CM | POA: Diagnosis not present

## 2023-06-21 DIAGNOSIS — Z0000 Encounter for general adult medical examination without abnormal findings: Secondary | ICD-10-CM | POA: Diagnosis not present

## 2023-06-21 DIAGNOSIS — R269 Unspecified abnormalities of gait and mobility: Secondary | ICD-10-CM | POA: Diagnosis not present

## 2023-06-21 DIAGNOSIS — I10 Essential (primary) hypertension: Secondary | ICD-10-CM | POA: Diagnosis not present

## 2023-08-15 DIAGNOSIS — H90A22 Sensorineural hearing loss, unilateral, left ear, with restricted hearing on the contralateral side: Secondary | ICD-10-CM | POA: Diagnosis not present

## 2023-08-15 DIAGNOSIS — H903 Sensorineural hearing loss, bilateral: Secondary | ICD-10-CM | POA: Diagnosis not present

## 2024-01-08 DIAGNOSIS — G309 Alzheimer's disease, unspecified: Secondary | ICD-10-CM | POA: Diagnosis not present

## 2024-01-08 DIAGNOSIS — I350 Nonrheumatic aortic (valve) stenosis: Secondary | ICD-10-CM | POA: Diagnosis not present

## 2024-01-08 DIAGNOSIS — R269 Unspecified abnormalities of gait and mobility: Secondary | ICD-10-CM | POA: Diagnosis not present

## 2024-01-08 DIAGNOSIS — I10 Essential (primary) hypertension: Secondary | ICD-10-CM | POA: Diagnosis not present

## 2024-01-08 DIAGNOSIS — M810 Age-related osteoporosis without current pathological fracture: Secondary | ICD-10-CM | POA: Diagnosis not present

## 2024-01-08 DIAGNOSIS — D510 Vitamin B12 deficiency anemia due to intrinsic factor deficiency: Secondary | ICD-10-CM | POA: Diagnosis not present

## 2024-03-20 DIAGNOSIS — Z961 Presence of intraocular lens: Secondary | ICD-10-CM | POA: Diagnosis not present

## 2024-03-20 DIAGNOSIS — H52203 Unspecified astigmatism, bilateral: Secondary | ICD-10-CM | POA: Diagnosis not present

## 2024-03-20 DIAGNOSIS — H524 Presbyopia: Secondary | ICD-10-CM | POA: Diagnosis not present
